# Patient Record
Sex: Male | Born: 1945 | Race: White | Hispanic: No | Marital: Married | Smoking: Never smoker
Health system: Southern US, Community
[De-identification: ages and names within clinical notes are randomized; demographics above are authoritative.]

## PROBLEM LIST (undated history)

## (undated) DIAGNOSIS — Z87898 Personal history of other specified conditions: Secondary | ICD-10-CM

## (undated) DIAGNOSIS — I10 Essential (primary) hypertension: Secondary | ICD-10-CM

## (undated) DIAGNOSIS — M1990 Unspecified osteoarthritis, unspecified site: Secondary | ICD-10-CM

## (undated) DIAGNOSIS — Z87442 Personal history of urinary calculi: Secondary | ICD-10-CM

## (undated) DIAGNOSIS — E785 Hyperlipidemia, unspecified: Secondary | ICD-10-CM

## (undated) HISTORY — DX: Essential (primary) hypertension: I10

## (undated) HISTORY — DX: Personal history of other specified conditions: Z87.898

## (undated) HISTORY — DX: Unspecified osteoarthritis, unspecified site: M19.90

## (undated) HISTORY — DX: Personal history of urinary calculi: Z87.442

## (undated) HISTORY — PX: TONSILLECTOMY: SHX5217

## (undated) HISTORY — DX: Hyperlipidemia, unspecified: E78.5

## (undated) HISTORY — PX: KNEE SURGERY: SHX244

## (undated) HISTORY — PX: TOTAL KNEE ARTHROPLASTY: SHX125

---

## 2003-12-30 ENCOUNTER — Ambulatory Visit (HOSPITAL_COMMUNITY): Admission: RE | Admit: 2003-12-30 | Discharge: 2003-12-30 | Payer: Self-pay | Admitting: Orthopedic Surgery

## 2004-01-30 ENCOUNTER — Ambulatory Visit (HOSPITAL_COMMUNITY): Admission: RE | Admit: 2004-01-30 | Discharge: 2004-01-30 | Payer: Self-pay | Admitting: Orthopedic Surgery

## 2004-01-30 ENCOUNTER — Ambulatory Visit (HOSPITAL_BASED_OUTPATIENT_CLINIC_OR_DEPARTMENT_OTHER): Admission: RE | Admit: 2004-01-30 | Discharge: 2004-01-30 | Payer: Self-pay | Admitting: Orthopedic Surgery

## 2004-04-17 ENCOUNTER — Ambulatory Visit: Payer: Self-pay | Admitting: Internal Medicine

## 2004-09-16 ENCOUNTER — Ambulatory Visit: Payer: Self-pay | Admitting: Internal Medicine

## 2004-09-23 ENCOUNTER — Ambulatory Visit: Payer: Self-pay | Admitting: Internal Medicine

## 2005-03-24 ENCOUNTER — Ambulatory Visit: Payer: Self-pay | Admitting: Internal Medicine

## 2005-05-02 ENCOUNTER — Ambulatory Visit: Payer: Self-pay | Admitting: Gastroenterology

## 2005-05-16 ENCOUNTER — Ambulatory Visit: Payer: Self-pay | Admitting: Gastroenterology

## 2005-05-16 ENCOUNTER — Encounter: Payer: Self-pay | Admitting: Internal Medicine

## 2005-05-16 ENCOUNTER — Encounter (INDEPENDENT_AMBULATORY_CARE_PROVIDER_SITE_OTHER): Payer: Self-pay | Admitting: Specialist

## 2005-09-25 ENCOUNTER — Ambulatory Visit: Payer: Self-pay | Admitting: Internal Medicine

## 2005-10-02 ENCOUNTER — Ambulatory Visit: Payer: Self-pay | Admitting: Internal Medicine

## 2005-12-30 ENCOUNTER — Ambulatory Visit: Payer: Self-pay | Admitting: Internal Medicine

## 2005-12-31 ENCOUNTER — Encounter: Admission: RE | Admit: 2005-12-31 | Discharge: 2005-12-31 | Payer: Self-pay | Admitting: Internal Medicine

## 2006-03-31 ENCOUNTER — Ambulatory Visit: Payer: Self-pay | Admitting: Internal Medicine

## 2006-05-12 ENCOUNTER — Ambulatory Visit: Payer: Self-pay | Admitting: Internal Medicine

## 2006-08-11 ENCOUNTER — Ambulatory Visit: Payer: Self-pay | Admitting: Internal Medicine

## 2006-10-21 DIAGNOSIS — E785 Hyperlipidemia, unspecified: Secondary | ICD-10-CM

## 2006-10-21 DIAGNOSIS — M1990 Unspecified osteoarthritis, unspecified site: Secondary | ICD-10-CM | POA: Insufficient documentation

## 2006-10-21 DIAGNOSIS — I10 Essential (primary) hypertension: Secondary | ICD-10-CM

## 2006-10-21 DIAGNOSIS — Z87898 Personal history of other specified conditions: Secondary | ICD-10-CM

## 2006-10-21 HISTORY — DX: Hyperlipidemia, unspecified: E78.5

## 2006-10-21 HISTORY — DX: Personal history of other specified conditions: Z87.898

## 2006-10-21 HISTORY — DX: Unspecified osteoarthritis, unspecified site: M19.90

## 2006-10-21 HISTORY — DX: Essential (primary) hypertension: I10

## 2007-03-01 ENCOUNTER — Ambulatory Visit: Payer: Self-pay | Admitting: Internal Medicine

## 2007-08-23 ENCOUNTER — Ambulatory Visit: Payer: Self-pay | Admitting: Internal Medicine

## 2007-08-23 LAB — CONVERTED CEMR LAB
BUN: 16 mg/dL (ref 6–23)
Bilirubin Urine: NEGATIVE
Blood in Urine, dipstick: NEGATIVE
Creatinine, Ser: 1.1 mg/dL (ref 0.4–1.5)
GFR calc Af Amer: 87 mL/min
Glucose, Bld: 111 mg/dL — ABNORMAL HIGH (ref 70–99)
Glucose, Urine, Semiquant: NEGATIVE
HDL: 32.7 mg/dL — ABNORMAL LOW (ref >39.0)
Ketones, urine, test strip: NEGATIVE
Lymphocytes Relative: 22.6 % (ref 12.0–46.0)
MCHC: 35 g/dL (ref 30.0–36.0)
Neutro Abs: 3.9 10*3/uL (ref 1.4–7.7)
Neutrophils Relative %: 65.2 % (ref 43.0–77.0)
Nitrite: NEGATIVE
PSA: 1.76 ng/mL (ref 0.10–4.00)
Potassium: 3.8 meq/L (ref 3.5–5.1)
Protein, U semiquant: NEGATIVE
RBC: 4.51 M/uL (ref 4.22–5.81)
Sodium: 142 meq/L (ref 135–145)
TSH: 0.86 microintl units/mL (ref 0.35–5.50)
Total Bilirubin: 0.9 mg/dL (ref 0.3–1.2)
Triglycerides: 189 mg/dL — ABNORMAL HIGH (ref 0–149)
Urobilinogen, UA: 0.2
VLDL: 38 mg/dL (ref 0–40)

## 2007-08-27 ENCOUNTER — Encounter: Payer: Self-pay | Admitting: Internal Medicine

## 2007-08-30 ENCOUNTER — Ambulatory Visit: Payer: Self-pay | Admitting: Internal Medicine

## 2007-08-30 DIAGNOSIS — N400 Enlarged prostate without lower urinary tract symptoms: Secondary | ICD-10-CM

## 2007-08-30 DIAGNOSIS — Z87442 Personal history of urinary calculi: Secondary | ICD-10-CM

## 2007-08-30 HISTORY — DX: Personal history of urinary calculi: Z87.442

## 2008-02-28 ENCOUNTER — Telehealth: Payer: Self-pay | Admitting: Internal Medicine

## 2008-04-18 ENCOUNTER — Ambulatory Visit: Payer: Self-pay | Admitting: Internal Medicine

## 2008-04-24 ENCOUNTER — Encounter: Payer: Self-pay | Admitting: Internal Medicine

## 2008-04-24 ENCOUNTER — Telehealth (INDEPENDENT_AMBULATORY_CARE_PROVIDER_SITE_OTHER): Payer: Self-pay

## 2008-05-10 ENCOUNTER — Inpatient Hospital Stay (HOSPITAL_COMMUNITY): Admission: RE | Admit: 2008-05-10 | Discharge: 2008-05-14 | Payer: Self-pay | Admitting: Orthopedic Surgery

## 2008-05-25 ENCOUNTER — Telehealth: Payer: Self-pay | Admitting: Internal Medicine

## 2008-09-05 ENCOUNTER — Ambulatory Visit: Payer: Self-pay | Admitting: Internal Medicine

## 2008-09-05 LAB — CONVERTED CEMR LAB
Albumin: 4.2 g/dL (ref 3.5–5.2)
Alkaline Phosphatase: 71 units/L (ref 39–117)
BUN: 22 mg/dL (ref 6–23)
Basophils Absolute: 0.1 10*3/uL (ref 0.0–0.1)
Bilirubin, Direct: 0.1 mg/dL (ref 0.0–0.3)
CO2: 29 meq/L (ref 19–32)
Chloride: 112 meq/L (ref 96–112)
Cholesterol: 178 mg/dL (ref 0–200)
Creatinine, Ser: 1.2 mg/dL (ref 0.4–1.5)
Direct LDL: 105.6 mg/dL
Eosinophils Absolute: 0.1 10*3/uL (ref 0.0–0.7)
GFR calc non Af Amer: 64.88 mL/min (ref >60)
HCT: 38.9 % — ABNORMAL LOW (ref 39.0–52.0)
HDL: 39.9 mg/dL (ref >39.00)
Neutrophils Relative %: 64.6 % (ref 43.0–77.0)
Nitrite: NEGATIVE
PSA: 2.41 ng/mL (ref 0.10–4.00)
Platelets: 279 10*3/uL (ref 150.0–400.0)
RDW: 14.3 % (ref 11.5–14.6)
TSH: 0.85 microintl units/mL (ref 0.35–5.50)
Total Protein: 7.3 g/dL (ref 6.0–8.3)
Urobilinogen, UA: 0.2
WBC Urine, dipstick: NEGATIVE
pH: 6

## 2008-09-15 ENCOUNTER — Ambulatory Visit: Payer: Self-pay | Admitting: Internal Medicine

## 2008-10-09 ENCOUNTER — Telehealth: Payer: Self-pay | Admitting: Internal Medicine

## 2008-10-09 ENCOUNTER — Encounter: Payer: Self-pay | Admitting: Internal Medicine

## 2008-11-15 ENCOUNTER — Inpatient Hospital Stay (HOSPITAL_COMMUNITY): Admission: RE | Admit: 2008-11-15 | Discharge: 2008-11-19 | Payer: Self-pay | Admitting: Orthopedic Surgery

## 2009-03-19 ENCOUNTER — Ambulatory Visit: Payer: Self-pay | Admitting: Internal Medicine

## 2009-09-14 ENCOUNTER — Ambulatory Visit: Payer: Self-pay | Admitting: Internal Medicine

## 2009-09-14 LAB — CONVERTED CEMR LAB
AST: 25 units/L (ref 0–37)
Albumin: 4.3 g/dL (ref 3.5–5.2)
Basophils Relative: 0.8 % (ref 0.0–3.0)
Bilirubin, Direct: 0.1 mg/dL (ref 0.0–0.3)
Cholesterol: 195 mg/dL (ref 0–200)
Direct LDL: 112 mg/dL
Eosinophils Relative: 1.6 % (ref 0.0–5.0)
HCT: 40.7 % (ref 39.0–52.0)
Ketones, urine, test strip: NEGATIVE
MCHC: 34.5 g/dL (ref 30.0–36.0)
MCV: 90.4 fL (ref 78.0–100.0)
Monocytes Relative: 10.1 % (ref 3.0–12.0)
Nitrite: NEGATIVE
Platelets: 268 10*3/uL (ref 150.0–400.0)
RBC: 4.5 M/uL (ref 4.22–5.81)
RDW: 14.3 % (ref 11.5–14.6)
Total CHOL/HDL Ratio: 4
Total Protein: 7 g/dL (ref 6.0–8.3)
Triglycerides: 215 mg/dL — ABNORMAL HIGH (ref 0.0–149.0)
VLDL: 43 mg/dL — ABNORMAL HIGH (ref 0.0–40.0)
WBC Urine, dipstick: NEGATIVE
WBC: 6.4 10*3/uL (ref 4.5–10.5)

## 2009-09-21 ENCOUNTER — Ambulatory Visit: Payer: Self-pay | Admitting: Internal Medicine

## 2010-05-14 NOTE — Assessment & Plan Note (Signed)
Summary: cpx/njr   Vital Signs:  Patient profile:   65 year old male Height:      70.5 inches Weight:      238 pounds BMI:     33.79 Temp:     97.6 degrees F oral BP sitting:   120 / 70  (left arm) Cuff size:   large  Vitals Entered By: Duard Brady LPN (September 21, 2009 8:49 AM) And the eye.  A the he and a theCC: cpx - doing well , Hypertension Management Is Patient Diabetic? No   CC:  cpx - doing well  and Hypertension Management.  History of Present Illness: 65 year old patient who is seen today for a wellness exam;  medical problems include hypertension, dyslipidemia osteoarthritis.  He also has a history of nephrolithiasis and mild BPH.  [place].  He had bilateral total knee replacement surgeries.  He is doing quite well.  He does monitor her blood pressure readings at home with nice  results.  He denies any cardiopulmonary complaints.  Hypertension History:      Positive major cardiovascular risk factors include male age 2 years old or older, hyperlipidemia, and hypertension.  Negative major cardiovascular risk factors include non-tobacco-user status.     Preventive Screening-Counseling & Management  Alcohol-Tobacco     Smoking Status: never  Allergies (verified): No Known Drug Allergies  Past History:  Past Medical History: Hyperlipidemia Hypertension Nephrolithiasis, hx of calcium oxalate stones Benign prostatic hypertrophy Osteoarthritis slp R TKR surgery 04-2008 s/ L TKR 11-2008  Past Surgical History: Arthroscopic Rt knee sx x 05-1985,2005 Tonsillectomy Colonoscopy-05/16/2005 left total knee  replacement surgery, January 2010 right total knee replacement surgery in September 2010 colonoscopy 05-2005  Family History: Reviewed history from 08/30/2007 and no changes required. Family History Hypertension father died age 13, aortic aneurysm rupture mother is in good health in her late 74s  One brother two sisters positive for hypertension  Social  History: Reviewed history from 08/30/2007 and no changes required. Married two adult childrenSmoking Status:  never  Physical Exam  General:  Well-developed,well-nourished,in no acute distress; alert,appropriate and cooperative throughout examination Head:  Normocephalic and atraumatic without obvious abnormalities. No apparent alopecia or balding. Eyes:  No corneal or conjunctival inflammation noted. EOMI. Perrla. Funduscopic exam benign, without hemorrhages, exudates or papilledema. Vision grossly normal. Ears:  External ear exam shows no significant lesions or deformities.  Otoscopic examination reveals clear canals, tympanic membranes are intact bilaterally without bulging, retraction, inflammation or discharge. Hearing is grossly normal bilaterally. Mouth:  Oral mucosa and oropharynx without lesions or exudates.  Teeth in good repair. Neck:  No deformities, masses, or tenderness noted. Chest Wall:  No deformities, masses, tenderness or gynecomastia noted. Breasts:  No masses or gynecomastia noted Lungs:  Normal respiratory effort, chest expands symmetrically. Lungs are clear to auscultation, no crackles or wheezes. Heart:  Normal rate and regular rhythm. S1 and S2 normal without gallop, murmur, click, rub or other extra sounds. Abdomen:  Bowel sounds positive,abdomen soft and non-tender without masses, organomegaly or hernias noted. Rectal:  No external abnormalities noted. Normal sphincter tone. No rectal masses or tenderness. Genitalia:  R testes atrophic.  R testes atrophic.   Prostate:  1+ enlarged.  1+ enlarged.   Msk:  No deformity or scoliosis noted of thoracic or lumbar spine.   Pulses:  R and L carotid,radial,femoral,dorsalis pedis and posterior tibial pulses are full and equal bilaterally Extremities:  status post bilateral total knee replacement surgeries Neurologic:  alert & oriented X3, cranial  nerves II-XII intact, strength normal in all extremities, gait normal, DTRs  symmetrical and normal, and heel-to-shin normal.  alert & oriented X3, cranial nerves II-XII intact, strength normal in all extremities, gait normal, DTRs symmetrical and normal, and heel-to-shin normal.   Skin:  Intact without suspicious lesions or rashes Cervical Nodes:  No lymphadenopathy noted Axillary Nodes:  No palpable lymphadenopathy Inguinal Nodes:  No significant adenopathy Psych:  Cognition and judgment appear intact. Alert and cooperative with normal attention span and concentration. No apparent delusions, illusions, hallucinations   Impression & Recommendations:  Problem # 1:  PHYSICAL EXAMINATION (ICD-V70.0)  Complete Medication List: 1)  Simvastatin 40 Mg Tabs (Simvastatin) .Marland Kitchen.. 1 once daily 2)  Indocin Sr 75 Mg Cpcr (Indomethacin) .Marland Kitchen.. 1 once daily 3)  Amlodipine Besylate 10 Mg Tabs (Amlodipine besylate) .Marland Kitchen.. 1 once daily 4)  Benazepril Hcl 40 Mg Tabs (Benazepril hcl) .Marland Kitchen.. 1 once daily 5)  Chlorthalidone 25 Mg Tabs (Chlorthalidone) .Marland Kitchen.. 1 once daily 6)  Bayer Aspirin 325 Mg Tabs (Aspirin) .Marland Kitchen.. 1 once daily  Other Orders: EKG w/ Interpretation (93000) Prescription Created Electronically [phone])  Hypertension Assessment/Plan:      The patient's hypertensive risk group is category B: At least one risk factor (excluding diabetes) with no target organ damage.  His calculated 10 year risk of coronary heart disease is 14 %.  Today's blood pressure is 120/70.    Patient Instructions: 1)  Please schedule a follow-up appointment in 1 year. 2)  Limit your Sodium (Salt). 3)  It is important that you exercise regularly at least 20 minutes 5 times a week. If you develop chest pain, have severe difficulty breathing, or feel very tired , stop exercising immediately and seek medical attention. 4)  You need to lose weight. Consider a lower calorie diet and regular exercise.  5)  Check your Blood Pressure regularly. If it is above: 150/90 you should make an  appointment. Prescriptions: CHLORTHALIDONE 25 MG  TABS (CHLORTHALIDONE) 1 once daily  #90 Each x 4   Entered and Authorized by:   [name]  MD   Signed by:   [name]  MD on 09/21/2009   Method used:   Electronically to        [facility] (retail)       [address]       [place], [place]  [address]       Ph: [phone]       Fax: [phone]   RxID:   [id number] BENAZEPRIL HCL 40 MG  TABS (BENAZEPRIL HCL) 1 once daily  #90 Each x 4   Entered and Authorized by:   [name]  MD   Signed by:   [name]  MD on 09/21/2009   Method used:   Electronically to        [facility] (retail)       [address]       [place], [place]  [address]       Ph: [phone]       Fax: [phone]   RxID:   [id number] AMLODIPINE BESYLATE 10 MG  TABS (AMLODIPINE BESYLATE) 1 once daily  #90 Each x 4   Entered and Authorized by:   [name]  MD   Signed by:   [name]  MD on 09/21/2009   Method  used:   Electronically to        [facility] (retail)       [address]       [place], [place]  [address]       Ph: [phone]       Fax: [phone]   RxID:   [id number] SIMVASTATIN 40 MG  TABS (SIMVASTATIN) 1 once daily  #90 Each x 4   Entered and Authorized by:   [name]  MD   Signed by:   [name]  MD on 09/21/2009   Method used:   Electronically to        [facility] (retail)       [address]       [place], [place]  [address]       Ph: [phone]       Fax: [phone]   RxID:   [id number]

## 2010-07-20 LAB — TYPE AND SCREEN
ABO/RH(D): O NEG
Antibody Screen: NEGATIVE

## 2010-07-20 LAB — PROTIME-INR
INR: 0.9 (ref 0.00–1.49)
INR: 1.7 — ABNORMAL HIGH (ref 0.00–1.49)
INR: 2 — ABNORMAL HIGH (ref 0.00–1.49)
Prothrombin Time: 19.5 seconds — ABNORMAL HIGH (ref 11.6–15.2)

## 2010-07-20 LAB — HEMOGLOBIN AND HEMATOCRIT, BLOOD
HCT: 37.3 % — ABNORMAL LOW (ref 39.0–52.0)
Hemoglobin: 12.7 g/dL — ABNORMAL LOW (ref 13.0–17.0)

## 2010-07-20 LAB — CBC
HCT: 31.9 % — ABNORMAL LOW (ref 39.0–52.0)
Hemoglobin: 13.3 g/dL (ref 13.0–17.0)
Hemoglobin: 9.3 g/dL — ABNORMAL LOW (ref 13.0–17.0)
MCHC: 34 g/dL (ref 30.0–36.0)
MCHC: 34.5 g/dL (ref 30.0–36.0)
MCV: 88.1 fL (ref 78.0–100.0)
MCV: 89 fL (ref 78.0–100.0)
RBC: 3.05 MIL/uL — ABNORMAL LOW (ref 4.22–5.81)
RBC: 3.62 MIL/uL — ABNORMAL LOW (ref 4.22–5.81)
WBC: 13.7 10*3/uL — ABNORMAL HIGH (ref 4.0–10.5)
WBC: 7.2 10*3/uL (ref 4.0–10.5)

## 2010-07-20 LAB — URINALYSIS, ROUTINE W REFLEX MICROSCOPIC
Bilirubin Urine: NEGATIVE
Glucose, UA: NEGATIVE mg/dL
Ketones, ur: NEGATIVE mg/dL
Nitrite: NEGATIVE
Urobilinogen, UA: 0.2 mg/dL (ref 0.0–1.0)
pH: 6.5 (ref 5.0–8.0)

## 2010-07-20 LAB — DIFFERENTIAL
Basophils Absolute: 0 10*3/uL (ref 0.0–0.1)
Basophils Relative: 1 % (ref 0–1)
Eosinophils Absolute: 0.1 10*3/uL (ref 0.0–0.7)
Eosinophils Relative: 2 % (ref 0–5)
Lymphocytes Relative: 25 % (ref 12–46)
Monocytes Absolute: 0.9 10*3/uL (ref 0.1–1.0)
Neutrophils Relative %: 60 % (ref 43–77)

## 2010-07-20 LAB — COMPREHENSIVE METABOLIC PANEL
Albumin: 4 g/dL (ref 3.5–5.2)
GFR calc Af Amer: >60 mL/min (ref >60)
Total Bilirubin: 0.7 mg/dL (ref 0.3–1.2)

## 2010-07-20 LAB — URINE CULTURE: Special Requests: NEGATIVE

## 2010-07-20 LAB — APTT: aPTT: 27 seconds (ref 24–37)

## 2010-07-29 LAB — CBC
MCV: 91 fL (ref 78.0–100.0)
Platelets: 249 10*3/uL (ref 150–400)
RDW: 13.3 % (ref 11.5–15.5)
WBC: 10.4 10*3/uL (ref 4.0–10.5)

## 2010-07-29 LAB — CROSSMATCH: ABO/RH(D): O NEG

## 2010-07-29 LAB — HEMOGLOBIN AND HEMATOCRIT, BLOOD
HCT: 23.6 % — ABNORMAL LOW (ref 39.0–52.0)
HCT: 24.1 % — ABNORMAL LOW (ref 39.0–52.0)
HCT: 37.1 % — ABNORMAL LOW (ref 39.0–52.0)
Hemoglobin: 10.8 g/dL — ABNORMAL LOW (ref 13.0–17.0)
Hemoglobin: 12.5 g/dL — ABNORMAL LOW (ref 13.0–17.0)
Hemoglobin: 8.1 g/dL — ABNORMAL LOW (ref 13.0–17.0)

## 2010-07-29 LAB — PROTIME-INR
INR: 0.9 (ref 0.00–1.49)
INR: 1.1 (ref 0.00–1.49)
INR: 1.6 — ABNORMAL HIGH (ref 0.00–1.49)
INR: 1.7 — ABNORMAL HIGH (ref 0.00–1.49)
Prothrombin Time: 14.3 seconds (ref 11.6–15.2)
Prothrombin Time: 18.5 seconds — ABNORMAL HIGH (ref 11.6–15.2)
Prothrombin Time: 19.3 seconds — ABNORMAL HIGH (ref 11.6–15.2)

## 2010-07-29 LAB — URINE CULTURE
Colony Count: NO GROWTH
Culture: NO GROWTH

## 2010-07-29 LAB — COMPREHENSIVE METABOLIC PANEL
AST: 18 U/L (ref 0–37)
Albumin: 4.2 g/dL (ref 3.5–5.2)
Chloride: 103 mEq/L (ref 96–112)
Creatinine, Ser: 1.01 mg/dL (ref 0.4–1.5)
GFR calc Af Amer: >60 mL/min (ref >60)
Potassium: 4.4 mEq/L (ref 3.5–5.1)
Total Bilirubin: 0.7 mg/dL (ref 0.3–1.2)
Total Protein: 7.1 g/dL (ref 6.0–8.3)

## 2010-07-29 LAB — DIFFERENTIAL
Basophils Absolute: 0 10*3/uL (ref 0.0–0.1)
Eosinophils Relative: 1 % (ref 0–5)
Lymphocytes Relative: 16 % (ref 12–46)
Lymphs Abs: 1.7 10*3/uL (ref 0.7–4.0)
Monocytes Absolute: 0.9 10*3/uL (ref 0.1–1.0)
Monocytes Relative: 9 % (ref 3–12)
Neutro Abs: 7.7 10*3/uL (ref 1.7–7.7)

## 2010-07-29 LAB — URINALYSIS, ROUTINE W REFLEX MICROSCOPIC
Nitrite: NEGATIVE
Protein, ur: NEGATIVE mg/dL
Specific Gravity, Urine: 1.021 (ref 1.005–1.030)
Urobilinogen, UA: 0.2 mg/dL (ref 0.0–1.0)

## 2010-07-29 LAB — TYPE AND SCREEN
ABO/RH(D): O NEG
Antibody Screen: NEGATIVE

## 2010-08-27 NOTE — Op Note (Signed)
NAME:  Jerry Beasley, Jerry Beasley               ACCOUNT NO.:  [id number]   MEDICAL RECORD NO.:  [id number]          PATIENT TYPE:  INP   LOCATION:  0010                         FACILITY:  [facility]   PHYSICIAN:  Georges Lynch. Gioffre, M.D.DATE OF BIRTH:  1945-06-10   DATE OF PROCEDURE:  DATE OF DISCHARGE:                               OPERATIVE REPORT   PREOPERATIVE DIAGNOSIS:  Degenerative arthritis of the left knee.   POSTOPERATIVE DIAGNOSIS:  Degenerative arthritis of the left knee.   OPERATION:  A left total knee arthroplasty utilizing the DePuy system.   I utilized, as far as the sizes, the patella was sized 41 mm with three  pegs.  The femoral component was a size 4 narrow, posterior stabilized  type, left.  The insert was a size 4 and 12.5 mm thickness.  The tibial  tray was a size 4 cemented and I used vancomycin in the cement.   PROCEDURE:  Under general anesthesia, routine orthopedic prep and  draping of the left lower extremity was carried out.  The leg was  exsanguinated and Esmarch tourniquet was elevated to 375 mmHg.  At this  time, the knee was flexed.  The anterior approach of the knee was  carried out.  Two flaps were created.  I then carried out a median  parapatellar incision reflecting the patella laterally and flexed the  knee and did medial lateral meniscectomies.  Incised the anterior and  posterior cruciate ligaments.  At this time, removed the spurs from the  femur, patella and the tibia.  Noted that he had a marked overgrowth of  bone on the medial side of his femur.  At this time, initial drill hole  was made in the intercondylar notch.  The intramedullary guide rod was  inserted and I removed 12 mm thickness off the distal femur because of  his contracture.  At that time the #2 jig was inserted for measurement  purposes.  We measured the femur to be a size 4.  At that particular  time we then cut our femur for a size 4, left.  We did anterior,  posterior and chamfer cuts.   We then went down and prepared the tibia in  usual fashion.  We removed 4 mm thickness off the tibial plateau  utilizing the medial side of the guide.  Once this was carried out.  We  then cut our keel, cut out of the proximal tibial plateau.  I then went  on and cut our notch out of the distal femur in the usual fashion.  We  then checked for posterior spurs.  We then inserted our spacer blocks in  for tension purposes.  We then removed those and inserted our trial  components.  We first tried the standard for a size 4 left femur.  There  was a little minimal overhang so we went to the narrow and it fit quite  nicely.  We then inserted our tibial tray and our size 4 with 12 mm  thickness tibial insert.  We then went through the trials.  We felt that  was our  best fit at that time.  We then cut our patella.  We did a  resurfacing procedure on the patella in the usual fashion.  We measured  the patella to be a size 41 and three drill holes were made in the  articular side of the patella.  Once this was done, we removed all trial  components and thoroughly water picked out the knee and then cemented  all three components in simultaneously.  Vancomycin was used in the  cement.  At that time, we then searched for loose pieces of cement.  We  removed all the loose pieces cement and water picked out the knee again  and then inserted our permanent size 4 with 12.5 mm thickness rotating  platform insert and reduced the knee.  We had good flexion and extension  and good stability medial and laterally.  We then water picked out the  knee and then prior to doing this we did inject 30 mL of 0.25% Marcaine  with 30 mg of Toradol.  We did that into the soft tissue.  After that we  inserted a Hemovac drain, closed the knee in layers in the usual  fashion.  The sterile Neosporin bundle dressing was applied.  The  patient had 2 grams of IV Ancef preop.   SURGEON:  Georges Lynch. Darrelyn Hillock, M.D.   ASSISTANT:   Jamelle Rushing, P.A.           ______________________________  Georges Lynch. Darrelyn Hillock, M.D.     RAG/MEDQ  D:  05/10/2008  T:  05/10/2008  Job:  [id number]   cc:   [name], MD  [address] [place]  [place] [address]

## 2010-08-27 NOTE — Op Note (Signed)
NAME:  Jerry Beasley, Jerry Beasley               ACCOUNT NO.:  [id number]   MEDICAL RECORD NO.:  [id number]          PATIENT TYPE:  INP   LOCATION:  0002                         FACILITY:  [facility]   PHYSICIAN:  Georges Lynch. Gioffre, M.D.DATE OF BIRTH:  1945/07/31   DATE OF PROCEDURE:  11/15/2008  DATE OF DISCHARGE:                               OPERATIVE REPORT   ASSISTANT:  Rozell Searing, PA.   PREOPERATIVE DIAGNOSIS:  Severe degenerative arthritis with a varus  deformity right knee.   POSTOPERATIVE DIAGNOSIS:  Severe degenerative arthritis with a varus  deformity right knee.   OPERATION:  A right total knee arthroplasty utilizing the DePuy system.  All three components were cemented and gentamicin was used in the  cement.  The sizes used were as follows: A size 5 right posterior  stabilized femoral component.  The tibial component was a size 5, 12.5  mm thickness.  The tibial tray was a size 4 cemented.  The patella was a  41 mm.   PROCEDURE:  Under general anesthesia routine orthopedic prep and draping  of the right lower extremity was carried out.  Leg was exsanguinated  with Esmarch.  Tourniquet was elevated to 375 mmHg.  Following that the  knee was flexed.  An incision was made over the anterior aspect of the  right knee.  Note the patient did have 2 g of IV Ancef.  Two flaps were  created.  Self-retaining retractors were inserted.  I then carried out a  median parapatellar incision.  The patella was reflected and the knee  was flexed.  At this time I did medial and lateral meniscectomies and  excised the anterior and posterior cruciate ligaments.  Following that I  then went on and removed all the spurs.   I then made my initial drill, cut the intercondylar notch, and a #1 jig  was inserted.  I removed 12 mm thickness off the distal femur because of  his contracture and his body size.  At this time, a #2 jig was inserted.  We carried out anterior-posterior chamfer cuts for a size 5 right  femur.  Following that we went ahead and prepared the tibia for a size 4 tibial  tray.  The appropriate intramedullary guide was inserted, 4 mm thickness  was taken off of the affected medial side.  Following that we cut our  keel cut out in the usual fashion.  Note, prior to doing that we did go  through all of our cuts and then utilized the spacer blocks.  Following  that we then, after preparing the tibia for a size 4 tray, cut our notch  cut-out of the distal femur.  I thoroughly irrigated out the area,  inserted our trial components, and we first started out with 10 mm  thickness insert then went to a 12.5-mm thickness insert for the tibial  portion.  I then did a resurfacing procedure on the patella in the usual  fashion.  Patella was cut in about 40-mm thickness.  Three drill holes  were made the patella for a size 41 patella.  After  that was done all  the trial components were removed.   We then thoroughly irrigated out the area and then dried the area out  and cemented all 3 components in simultaneously utilizing gentamicin in  the cement.  Once the cement was hardened we removed all loose pieces of  cement.  We then removed our trial tibial insert and looked for more  pieces of cement and removed those, waterpikked the knee out  posteriorly.  After that we then went ahead and inserted our permanent  12.5-mm thickness size 5 tibial tray.  We reduced the knee and had good  function of the knee; good flexion, good extension, good  lateral medial stability.  I then inserted the Hemovac drain.  FloSeal  10 cc, then was inserted into the joint surface soft tissue areas.  I  then reapproximated the wound in layers in the usual fashion.  Skin was  closed with metal staples.  Sterile Neosporin dressing was applied.  The  patient left the operative satisfactory condition.           ______________________________  Georges Lynch Darrelyn Hillock, M.D.     RAG/MEDQ  D:  11/15/2008  T:  11/15/2008   Job:  [id number]   cc:   [name], MD  [address] [place]  [place] [address]

## 2010-08-27 NOTE — H&P (Signed)
NAME:  Jerry Beasley, Jerry Beasley               ACCOUNT NO.:  [id number]   MEDICAL RECORD NO.:  [id number]          PATIENT TYPE:  INP   LOCATION:                               FACILITY[facility]  [facility]   PHYSICIAN:  Georges Lynch. Gioffre, M.D.DATE OF BIRTH:  06-30-1945   DATE OF ADMISSION:  05/10/2008  DATE OF DISCHARGE:                              HISTORY & PHYSICAL   DATE OF ADMISSION:  May 10, 2008.   CHIEF COMPLAINT:  Bilateral knee pain.   HISTORY OF PRESENT ILLNESS:  The patient is a 65 year old gentleman who  has been evaluated by Dr. Darrelyn Hillock for bilateral knee pain.  He is found  to have end-stage osteoarthritis bilateral knees with bone-on-bone  medial compartments and significant patellofemoral changes.  He has  failed conservative treatment.  The patient has elected to proceed with  a left total knee arthroplasty first.   ALLERGIES:  No known drug allergies.   PRIMARY CARE PHYSICIAN:  [name], MD.   CURRENT MEDICATIONS:  1. Indocin 75 mg a Jerry Beasley and will stop at this point.  2. Benazepril 40 mg a Jerry Beasley.  3. Simvastatin 40 mg a Jerry Beasley.  4. Amlodipine 10 mg a Jerry Beasley.  5. Chlorthalidone 25 mg a Jerry Beasley.   PAST MEDICAL HISTORY:  1. Includes hypertension.  2. Hypercholesterolemia.  3. History of kidney stones in 2007.   REVIEW OF SYSTEMS:  Is negative for any neurologic issues.  PULMONARY:  Is unremarkable.  CARDIOVASCULAR:  Blood pressure with medicines stable.  He has never had a stress test.  He denies any chest pains.  GU is  unremarkable.  GI is unremarkable.  HEMATOLOGIC:  Is unremarkable.   PAST SURGICAL HISTORY:  Includes right knee surgery x2 without any  complications with anesthesia.   FAMILY MEDICAL HISTORY:  Father is deceased from abdominal aortic  aneurysm.  Mother is alive at the age of 17, in good health.   SOCIAL HISTORY:  Patient is married.  He is an Nature conservation officer.  He  has smoked previous in early 20s, nothing since.  No street or alcohol  drug issues.  He  has two children, lives with his family in a two-story  home.   PHYSICAL EXAM:  VITALS:  Height is 6 feet, weight is 230, blood pressure  is 138/78, pulse of 70, respirations 12.  Patient is afebrile.  GENERAL:  This is a healthy-appearing, well-developed gentleman,  conscious, alert and appropriate.  Appears to be good historian.  Does  walk with am obvious bow-legged type deformity.  HEENT:  Head was normocephalic.  Pupils equal, round, reactive.  Gross  hearing is intact.  Buccal mucosa is pink and moist.  NECK:  Supple.  No palpable lymphadenopathy.  No thyroid discomfort.  Good range of motion.  CHEST:  Lung sounds were clear and equal bilaterally.  No wheezes,  rales, rhonchi.  HEART:  Regular rate and rhythm.  ABDOMEN:  Soft, nontender.  No CVA region discomfort.  Bowel sounds  normal.  UPPER EXTREMITIES:  Have an excellent range of motion in shoulders,  elbows, wrists.  Motor strength was  5/5.  LOWER EXTREMITIES:  He has good range of motion of both hips.  Both  knees fully extend and flex back to 120 degrees.  He has no instability.  He has no effusions.  They are boggy appearing.  He has no calf  tenderness.  No signs of infections about his lower extremities.  Good  motion at the ankles.  PERIPHERAL VASCULAR:  Carotid pulses were 2+, no bruits.  Radial pulses  2+, dorsalis pedis pulses 2+.  He had no lower extremity edema.  NEURO:  The patient was conscious, alert and appropriate.  Good  historian.  BREAST, RECTAL AND GU:  Exams deferred at this time.   IMPRESSION:  1. End-stage osteoarthritis bilateral knees, left greater than right.  2. Hypertension.  3. Hypercholesterolemia.  4. History of kidney stones.   PLAN:  The patient will undergo all routine laboratories and tests prior  to having a left total knee arthroplasty by Dr. Darrelyn Hillock at [facility] on May 10, 2008.      Jamelle Rushing, P.A.    ______________________________  Georges Lynch  Darrelyn Hillock, M.D.    RWK/MEDQ  D:  05/03/2008  T:  05/03/2008  Job:  [id number]   cc:   Windy Fast A. Darrelyn Hillock, M.D.  Fax: [phone]

## 2010-08-30 NOTE — Discharge Summary (Signed)
Jerry Beasley, Jerry Beasley               ACCOUNT NO.:  [id number]   MEDICAL RECORD NO.:  [id number]          PATIENT TYPE:  INP   LOCATION:  1603                         FACILITY:  [facility]   PHYSICIAN:  Jerry Lynch. Gioffre, M.D.DATE OF BIRTH:  03/07/1946   DATE OF ADMISSION:  05/10/2008  DATE OF DISCHARGE:  05/14/2008                               DISCHARGE SUMMARY   ADMISSION DIAGNOSES:  1. End-stage osteoarthritis, bilateral knees, left greater than right.  2. Hypertension.  3. Hypercholesterolemia.  4. History of kidney stones.   DISCHARGE DIAGNOSES:  1. Left total knee arthroplasty.  2. Postoperative blood loss anemia, symptomatic, required 2 units of      packed red blood cells.  3. Hypertension.  4. Hypercholesterolemia.  5. History of kidney stones.   HISTORY OF PRESENT ILLNESS:  Patient is a 65 year old gentleman who was  evaluated by Dr. Darrelyn Beasley for significant progressive worsening bilateral  knee pain.  He was found to have end-stage osteoarthritis, bilateral  knees, with bone-on-bone medial compartments with significant  patellofemoral changes.  Patient has failed conservative treatment.  Patient has elected to proceed with a left total knee arthroplasty  first.   ALLERGIES:  NO KNOWN DRUG ALLERGIES.   CURRENT MEDICATIONS:  1. Indocin 75 mg once a day and will stop prior to surgery.  2. Benazepril 40 mg once a day.  3. Simvastatin 40 mg once a day.  4. Amlodipine 10 mg once a day.  5. Chlorthalidone 25 mg once a day.   SURGICAL PROCEDURE:  On May 10, 2008, patient was taken to the OR by  Dr. Ranee Gosselin, assisted by Oneida Alar, P.A.-C.  Under general  anesthesia, the patient underwent a left total knee arthroplasty with a  DePuy rotating platform system.  Patient tolerated the procedure well.  There were no complications.  Minimal blood loss.  Patient was  transferred to the recovery room and then to the orthopedic floor in  good condition.  Patient had the  following components implanted:  A size  4 narrow left femoral component, a size 4 keeled tibial tray, a size  four 12.5-mm thickness polyethylene bearing, a 41-mm 3 peg patella.  All  components were implanted with polymethyl methacrylate with vancomycin  mixed in.  Patient tolerated the procedure well and was transferred to  the floor for total knee protocol.   CONSULTS:  The following routine counseled the requested physical  therapy, occupational therapy, case management, pharmacy for Coumadin  monitoring.   HOSPITAL COURSE:  On May 10, 2008, patient was admitted to [facility] under the care of Dr. Ranee Gosselin.  Patient was taken  to the OR where a left total knee arthroplasty was performed without any  complications.  Patient was transferred to the recovery room and then to  the orthopedic floor in good condition with IV antibiotics, pain  medicines, and Coumadin for DVT prophylaxis on a total knee protocol.  Patient then had 4 days postoperative course in which the patient did  develop some postoperative blood loss anemia.  On admission, his  hemoglobin was 14, he dropped  to 8.1.  He was on the pale sided, a  little lethargic.  He was slightly tachycardic so prior to discharge he  was typed and crossed and transfused 2 units of packed red blood cells.  Patient tolerated this well.  Otherwise, patient's vital signs remained  stable.  He remained afebrile.  Patient was able to wean off his IV  antibiotics and pain medicines without any issues.  His wound remained  benign for any signs of infections.  Leg remained neuromotor vascularly  intact.  Patient progressed well with physical therapy.  He was able to  ambulate in excess of 65 feet.  He followed instructions well, was able  to progress with his exercises so on postop day #4 after his transfusion  he tolerated it well.  He was discharged in good condition with routine  outpatient followup with home health  physical therapy and an R.N. for  Coumadin monitoring.   LABS:  CBC on admission found WBCs 10.4, hemoglobin 14.3, hematocrit  42.4, platelets 249.  On discharge, his H and H was 8.1 and 23.2.  It  was not checked after that but he tolerated it well without any issues.  His INR on discharge is 1.7.  Routine chemistries on admission within  normal limits with a glucose of 135.  Estimated GFR was greater than 60.  Urinalysis on admission was in normal limits with no growth.  Patient  was typed and crossed and transfused 2 units of packed red blood cells  without any side effects.   DISCHARGE INSTRUCTIONS:   DIET:  No restrictions.   ACTIVITY:  He is to increase his activity slowly with use of a walker  and physical therapy instructions.   WOUND CARE:  He is to change his dressing on a daily basis.   FOLLOWUP:  He needs a 2-week followup appointment with Dr. Darrelyn Beasley in  his office.  Patient is to call [phone] for that appointment.  Home  health care physical therapy through [place] [facility] with Coumadin monitoring.   MEDICATIONS:  1. Coumadin 7.5 mg once a day until changed by [place] pharmacist.  2. Robaxin 500 mg once every 6 hours for muscle spasms if needed.  3. Percocet 10/650 one or 2 tablets every 6 hours for muscle spasms if      needed.  4. Amlodipine 10 mg once a day.  5. Chlorthalidone 25 mg once a day.  6. Simvastatin 40 mg once a day.  7. Benazepril 40 mg once a day.  8. Indomethacin, on hold.  9. Aspirin on hold until done with Coumadin.   PATIENT'S CONDITION UPON DISCHARGE TO HOME:  Listed as improved and  good.      Jerry Beasley, P.A.    ______________________________  Jerry Beasley, M.D.    Jerry Beasley  D:  05/31/2008  T:  05/31/2008  Job:  [id number]   cc:   Windy Fast A. Darrelyn Beasley, M.D.  Fax: [phone]

## 2010-08-30 NOTE — Op Note (Signed)
NAME:  Jerry Beasley, Jerry Beasley               ACCOUNT NO.:  [id number]   MEDICAL RECORD NO.:  [id number]          PATIENT TYPE:  AMB   LOCATION:  NESC                         FACILITY:  [facility]   PHYSICIAN:  Georges Lynch. Gioffre, M.D.DATE OF BIRTH:  15-Jan-1946   DATE OF PROCEDURE:  01/30/2004  DATE OF DISCHARGE:                                 OPERATIVE REPORT   SURGEON:  Georges Lynch. Darrelyn Hillock, M.D.   ASSISTANT:  Nurse.   PREOPERATIVE DIAGNOSES:  1.  Degenerative arthritis, right knee.  2.  Complete tear to the medial meniscus, right knee.   POSTOPERATIVE DIAGNOSES:  1.  Degenerative arthritis, right knee.  2.  Complete tear to the medial meniscus, right knee.   OPERATIONS:  1.  Diagnostic arthroscopy, right knee.  2.  Abrasion and chondroplasty, medial femoral condyle -- right knee.  3.  Medial meniscectomy, right knee.   DESCRIPTION OF PROCEDURE:  Under general anesthesia, routine orthopedic prep  and draping of the right lower extremity was carried out.  He had 1 g of IV  Ancef.   A small incision was made in the suprapatellar pouch, and inflow cannula was  inserted and the knee was distended with saline.  Another small incision was  made in the anterolateral joint, and the arthroscope was entered and  complete diagnostic arthroscopy was carried out.  He had one small loose  body that I removed easily.  Following this, I noted the suprapatellar pouch  region was fine, and the patella looked good.  Went over the lateral joint  and he had obvious loss of cartilage of the distal femur and lateral femoral  condyle.  The lateral meniscus was intact.  The ACL and PCL were intact.  Medial joint space was the main problem; he had rather severe degenerative  changes involving the tibial plateau as well as the medial femoral condyle.  He also had a severe complex tear of the medial meniscus.   I introduced the shaver-sucker device, did an abrasion and chondroplasty of  the medial femoral condyle and  also did a partial medial meniscectomy.  I  thoroughly irrigated out the knee, removed all the fluid.  I then closed all  three punctate incisions with 3-0 nylon suture.  I injected 30 cc of 0.5%  Marcaine with epinephrine into the knee joint.  Sterile Neosporin bundle  dressing was applied.   FOLLOWUP CARE:  1.  He will be on crutches for weightbearing as tolerated.  2.  He will be on aspirin 325 mg b.i.d., starting today and for two weeks as      an anticoagulant.  3.  He will be on [facility] for pain.  4.  He will see me in the office in 12-14 days, or prior to if there are      problems.      RAG/MEDQ  D:  01/30/2004  T:  01/30/2004  Job:  [id number]

## 2010-08-30 NOTE — Discharge Summary (Signed)
Jerry Beasley, Jerry Beasley               ACCOUNT NO.:  [id number]   MEDICAL RECORD NO.:  [id number]          PATIENT TYPE:  INP   LOCATION:  1617                         FACILITY:  [facility]   PHYSICIAN:  Georges Lynch. Gioffre, M.D.DATE OF BIRTH:  08-30-1945   DATE OF ADMISSION:  11/15/2008  DATE OF DISCHARGE:  11/19/2008                               DISCHARGE SUMMARY   ADMITTING DIAGNOSES:  1. Degenerative arthritis, right knee.  2. Hypertension.  3. Hypercholesterolemia.   DISCHARGE DIAGNOSES:  1. Degenerative arthritis, right knee, status post left total knee      arthroplasty.  2. Hypertension.  3. Hypercholesterolemia.   PROCEDURE:  On November 15, 2008, right total knee arthroplasty.  Surgeon,  Dr. Darrelyn Hillock.  Assistant, Rozell Searing, P.A.-C.  Under general  anesthesia.   CONSULTATIONS:  None.   BRIEF HISTORY:  Jerry Beasley is a 65 year old gentleman who has been  followed by Dr. Darrelyn Hillock for worsening bilateral knee pain.  Patient  states that both knees are painful but the right knee seems to be more  painful.  He has had arthroscopy done on this knee in the past in 2005  and feels that his knee has not been 100% since that point.  Patient  states that his knee is achy, gives away frequently, and is preventing  him from doing things he would like to do.  Radiographs reveal that the  patient is bone on bone in the medial compartment.  Patient also has  tried conservative measures of joint injections without relief.  Patient  now presents for right total knee arthroplasty.   LABORATORY DATA:  Preoperative CBC revealed white count of 7.2,  hemoglobin 13.3, hematocrit 39.1 and platelet count 263.  Patient's  preoperative chemistry panel was unremarkable, as well as his  preoperative urinalysis.  Preoperative pro time 12.5 and preoperative  INR 0.9.  Serial CBCs were drawn throughout the patient's hospital stay.  Patient's hemoglobin did drop as low as 9.3.  He did not require  transfusion  through his hospital stay.  Patient's INR was measured at  1.7 by postoperative day 3.  Patient's vital signs remained stable  throughout the stay.  EKG, November 15, 2008, normal EKG within normal  limits, confirmed by Dr. Darrelyn Hillock.   HOSPITAL COURSE:  The patient was admitted to [facility],  taken to the OR, underwent the above-stated procedure without any  complications.  The patient tolerated the procedure well.  Later, the  patient was transferred to the recovery room and then to the orthopedic  floor, started on PCA and p.o. analgesic pain control following surgery.  Postoperative day 1, patient was doing well.  States that he was  evaluated by physical therapy on postoperative day 0.  Postoperative day  2, patient was doing well with therapy.  He was able to ambulate 55 feet  using the walker with minimal assistance.  Patient also able to  transfer, sit to stand, stand to sit with minimal assistance.  Postoperative day 3, patient continues to do well.  Hemoglobin 9.3,  hematocrit 27.1, INR 1.7.  Patient continues to meet  his physical  therapy goals, able to ambulate 250 feet using the walker with some  assistance.  PT notes that gait was slow and steady.  Patient also able  to go up and down the stairs forward using 1 rail.  He was able to go up  and down 5 steps without assistance.  Postoperative day 4, patient  continues to do well.  Pain is completely managed by p.o. analgesics at  this point.  Patient ready to go home today.  [name] is arranged for  home physical therapy, as well as to manage Coumadin.   DISCHARGE PLAN:  1. The patient is discharged to home on November 19, 2008.  2. Discharge diagnoses:  Please see above.  3. Discharge medications:      a.     Benazepril 40 mg 1 tab p.o. daily.      b.     Simvastatin 40 mg 1 tab p.o. daily.      c.     Amlodipine 1 tab p.o. daily.      d.     Chlorthalidone 25 mg 1 tab p.o. daily.      e.     Percocet 10/650 mg 1 to 2  tabs p.o. every 4 to 6 hours       p.r.n. pain.      f.     Robaxin 500 mg 1 tab p.o. t.i.d. p.r.n. muscle spasms.      g.     Coumadin dose per day [facility] with the goal of INR       between 2.0 and 3.0.  Patient is to continue on Coumadin until 3       weeks from the date of surgery.   DIET:  No restrictions.   ACTIVITY:  Patient is on total knee protocol.  He is weightbearing as  tolerated.  [name] has arranged for home health to continue PT, OT,  ADLs, and gait training.  [name] will also continue to monitor  patient's PT/INR and dose of Coumadin.   FOLLOWUP:  Patient to arrange followup for 2 weeks from the date of  surgery with Dr. Darrelyn Hillock.  Please call [phone] to arrange appointment.  Patient also needs to change the dressing daily.  For the first 3 days  while he is home, patient needs to cover with saran wrap so as not to  submerge the incision.  After that, it is okay to leave open, however,  daily dressing changes will be required.   DISPOSITION:  Home.   CONDITION ON DISCHARGE:  Improved.      Rozell Searing, PAC    ______________________________  Georges Lynch Darrelyn Hillock, M.D.    LD/MEDQ  D:  12/11/2008  T:  12/11/2008  Job:  [id number]

## 2010-09-20 ENCOUNTER — Other Ambulatory Visit (INDEPENDENT_AMBULATORY_CARE_PROVIDER_SITE_OTHER): Payer: PRIVATE HEALTH INSURANCE

## 2010-09-20 DIAGNOSIS — Z0000 Encounter for general adult medical examination without abnormal findings: Secondary | ICD-10-CM

## 2010-09-20 LAB — CBC WITH DIFFERENTIAL/PLATELET
Basophils Relative: 0.8 % (ref 0.0–3.0)
Eosinophils Relative: 1.4 % (ref 0.0–5.0)
HCT: 42 % (ref 39.0–52.0)
Hemoglobin: 14.3 g/dL (ref 13.0–17.0)
Lymphs Abs: 1.6 10*3/uL (ref 0.7–4.0)
MCV: 91.5 fl (ref 78.0–100.0)
Monocytes Relative: 10.2 % (ref 3.0–12.0)
Neutro Abs: 4.8 10*3/uL (ref 1.4–7.7)
WBC: 7.3 10*3/uL (ref 4.5–10.5)

## 2010-09-20 LAB — BASIC METABOLIC PANEL
Chloride: 106 mEq/L (ref 96–112)
GFR: 63.25 mL/min (ref >60.00)
Potassium: 4.8 mEq/L (ref 3.5–5.1)
Sodium: 142 mEq/L (ref 135–145)

## 2010-09-20 LAB — POCT URINALYSIS DIPSTICK
Glucose, UA: NEGATIVE
Ketones, UA: NEGATIVE
Protein, UA: NEGATIVE

## 2010-09-20 LAB — LIPID PANEL
Cholesterol: 224 mg/dL — ABNORMAL HIGH (ref 0–200)
Total CHOL/HDL Ratio: 5
VLDL: 57.6 mg/dL — ABNORMAL HIGH (ref 0.0–40.0)

## 2010-09-20 LAB — HEPATIC FUNCTION PANEL
Albumin: 4.7 g/dL (ref 3.5–5.2)
Alkaline Phosphatase: 74 U/L (ref 39–117)
Bilirubin, Direct: 0.1 mg/dL (ref 0.0–0.3)
Total Bilirubin: 0.8 mg/dL (ref 0.3–1.2)

## 2010-09-20 LAB — LDL CHOLESTEROL, DIRECT: Direct LDL: 130.9 mg/dL

## 2010-09-24 ENCOUNTER — Other Ambulatory Visit: Payer: Self-pay | Admitting: Internal Medicine

## 2010-09-25 ENCOUNTER — Encounter: Payer: Self-pay | Admitting: Internal Medicine

## 2010-09-27 ENCOUNTER — Encounter: Payer: Self-pay | Admitting: Internal Medicine

## 2010-09-27 ENCOUNTER — Ambulatory Visit (INDEPENDENT_AMBULATORY_CARE_PROVIDER_SITE_OTHER): Payer: PRIVATE HEALTH INSURANCE | Admitting: Internal Medicine

## 2010-09-27 VITALS — BP 128/80 | HR 84 | Temp 98.0°F | Resp 18 | Ht 70.5 in | Wt 249.0 lb

## 2010-09-27 DIAGNOSIS — Z23 Encounter for immunization: Secondary | ICD-10-CM

## 2010-09-27 DIAGNOSIS — Z0000 Encounter for general adult medical examination without abnormal findings: Secondary | ICD-10-CM

## 2010-09-27 DIAGNOSIS — I10 Essential (primary) hypertension: Secondary | ICD-10-CM

## 2010-09-27 MED ORDER — AMLODIPINE BESYLATE 10 MG PO TABS: 10.0000 mg | ORAL_TABLET | Freq: Every day | ORAL | Status: DC

## 2010-09-27 MED ORDER — SIMVASTATIN 40 MG PO TABS: 40.0000 mg | ORAL_TABLET | Freq: Every day | ORAL | Status: DC

## 2010-09-27 MED ORDER — CHLORTHALIDONE 25 MG PO TABS: 25.0000 mg | ORAL_TABLET | Freq: Every day | ORAL | Status: DC

## 2010-09-27 MED ORDER — BENAZEPRIL HCL 40 MG PO TABS: 40.0000 mg | ORAL_TABLET | Freq: Every day | ORAL | Status: DC

## 2010-09-27 NOTE — Progress Notes (Signed)
  Subjective:    Patient ID: Jerry Beasley, male    DOB: Nov 16, 1945, 65 y.o.   MRN: [id number]  HPI  65 year old patient seen today for followup and for his annual exam. Medical problems include treated hypertension on triple therapy. He continues to do well. He is fairly asymptomatic today but unfortunately continues to gain weight he has osteoarthritis and has had bilateral knee replacement surgeries. Remains on simvastatin 40 mg daily for dyslipidemia. He denies any cardiopulmonary complaints.   Wt Readings from Last 3 Encounters:  09/27/10 249 lb (112.946 kg)  09/21/09 238 lb (107.956 kg)  03/19/09 223 lb (101.152 kg)    Review of Systems  Constitutional: Negative for fever, chills, activity change, appetite change and fatigue.  HENT: Negative for hearing loss, ear pain, congestion, rhinorrhea, sneezing, mouth sores, trouble swallowing, neck pain, neck stiffness, dental problem, voice change, sinus pressure and tinnitus.   Eyes: Negative for photophobia, pain, redness and visual disturbance.  Respiratory: Negative for apnea, cough, choking, chest tightness, shortness of breath and wheezing.   Cardiovascular: Negative for chest pain, palpitations and leg swelling.  Gastrointestinal: Negative for nausea, vomiting, abdominal pain, diarrhea, constipation, blood in stool, abdominal distention, anal bleeding and rectal pain.  Genitourinary: Negative for dysuria, urgency, frequency, hematuria, flank pain, decreased urine volume, discharge, penile swelling, scrotal swelling, difficulty urinating, genital sores and testicular pain.  Musculoskeletal: Negative for myalgias, back pain, joint swelling, arthralgias and gait problem.  Skin: Negative for color change, rash and wound.  Neurological: Negative for dizziness, tremors, seizures, syncope, facial asymmetry, speech difficulty, weakness, light-headedness, numbness and headaches.  Hematological: Negative for adenopathy. Does not bruise/bleed  easily.  Psychiatric/Behavioral: Negative for suicidal ideas, hallucinations, behavioral problems, confusion, sleep disturbance, self-injury, dysphoric mood, decreased concentration and agitation. The patient is not nervous/anxious.        Objective:   Physical Exam  Constitutional: He appears well-developed and well-nourished.       Weight 249. Blood pressure 124/64  HENT:  Head: Normocephalic and atraumatic.  Right Ear: External ear normal.  Left Ear: External ear normal.  Nose: Nose normal.  Mouth/Throat: Oropharynx is clear and moist.  Eyes: Conjunctivae and EOM are normal. Pupils are equal, round, and reactive to light. No scleral icterus.  Neck: Normal range of motion. Neck supple. No JVD present. No thyromegaly present.  Cardiovascular: Regular rhythm, normal heart sounds and intact distal pulses.  Exam reveals no gallop and no friction rub.   No murmur heard. Pulmonary/Chest: Effort normal and breath sounds normal. He exhibits no tenderness.  Abdominal: Soft. Bowel sounds are normal. He exhibits no distension and no mass. There is no tenderness.  Genitourinary: Penis normal. Guaiac negative stool.       +2 enlarged  Musculoskeletal: Normal range of motion. He exhibits no edema and no tenderness.  Lymphadenopathy:    He has no cervical adenopathy.  Neurological: He is alert. He has normal reflexes. No cranial nerve deficit. Coordination normal.  Skin: Skin is warm and dry. No rash noted.  Psychiatric: He has a normal mood and affect. His behavior is normal.          Assessment & Plan:   Annual clinical exam Exercise obesity with weight gain Hypertension well controlled Dyslipidemia  Exercise weight loss encouraged. Present medical regimen unchanged

## 2010-09-27 NOTE — Patient Instructions (Signed)
Limit your sodium (Salt) intake    It is important that you exercise regularly, at least 20 minutes 3 to 4 times per week.  If you develop chest pain or shortness of breath seek  medical attention.  You need to lose weight.  Consider a lower calorie diet and regular exercise.  Return in one year for follow-up 

## 2010-10-10 ENCOUNTER — Other Ambulatory Visit: Payer: Self-pay

## 2010-10-10 NOTE — Telephone Encounter (Signed)
Opened in error

## 2011-03-21 ENCOUNTER — Telehealth: Payer: Self-pay | Admitting: Internal Medicine

## 2011-03-21 NOTE — Telephone Encounter (Signed)
Pt had shingles a few years ago and is interested in getting the shingles shot but would like to know since he has already had it if it would be beneficial to have the shot.please advise

## 2011-03-21 NOTE — Telephone Encounter (Signed)
Attempt to call - VM - LMTCB if futher questions - yes we do encourage shingles vaccine - even if he has alraeady had a case of shingles - no promise that he wont get it again if he get vaccine - we just hope the sx would not be that bad and he would get over it quicker

## 2011-03-21 NOTE — Telephone Encounter (Signed)
Yes, still a CDC recommended vaccine

## 2011-03-21 NOTE — Telephone Encounter (Signed)
Pt requesting more information

## 2011-03-21 NOTE — Telephone Encounter (Signed)
Please call and schedule injection if pt wants - tell him to check with insurance about cost to him before coming in - because it cost [phone] here.

## 2011-09-22 ENCOUNTER — Other Ambulatory Visit: Payer: PRIVATE HEALTH INSURANCE | Admitting: Internal Medicine

## 2011-09-29 ENCOUNTER — Ambulatory Visit (INDEPENDENT_AMBULATORY_CARE_PROVIDER_SITE_OTHER): Payer: Medicare Other | Admitting: Internal Medicine

## 2011-09-29 ENCOUNTER — Encounter: Payer: Self-pay | Admitting: Internal Medicine

## 2011-09-29 VITALS — BP 130/74 | HR 84 | Temp 98.0°F | Resp 18 | Ht 70.0 in | Wt 247.0 lb

## 2011-09-29 DIAGNOSIS — M1990 Unspecified osteoarthritis, unspecified site: Secondary | ICD-10-CM

## 2011-09-29 DIAGNOSIS — N400 Enlarged prostate without lower urinary tract symptoms: Secondary | ICD-10-CM | POA: Diagnosis not present

## 2011-09-29 DIAGNOSIS — E785 Hyperlipidemia, unspecified: Secondary | ICD-10-CM | POA: Diagnosis not present

## 2011-09-29 DIAGNOSIS — Z0000 Encounter for general adult medical examination without abnormal findings: Secondary | ICD-10-CM

## 2011-09-29 DIAGNOSIS — I10 Essential (primary) hypertension: Secondary | ICD-10-CM

## 2011-09-29 DIAGNOSIS — Z136 Encounter for screening for cardiovascular disorders: Secondary | ICD-10-CM | POA: Diagnosis not present

## 2011-09-29 LAB — COMPREHENSIVE METABOLIC PANEL
ALT: 28 U/L (ref 0–53)
Albumin: 4 g/dL (ref 3.5–5.2)
CO2: 22 mEq/L (ref 19–32)
Calcium: 9.1 mg/dL (ref 8.4–10.5)
Chloride: 109 mEq/L (ref 96–112)
GFR: 74.97 mL/min (ref >60.00)
Glucose, Bld: 112 mg/dL — ABNORMAL HIGH (ref 70–99)
Sodium: 139 mEq/L (ref 135–145)
Total Bilirubin: 0.6 mg/dL (ref 0.3–1.2)
Total Protein: 7 g/dL (ref 6.0–8.3)

## 2011-09-29 LAB — CBC WITH DIFFERENTIAL/PLATELET
Basophils Absolute: 0 10*3/uL (ref 0.0–0.1)
Eosinophils Relative: 1.4 % (ref 0.0–5.0)
Lymphocytes Relative: 19.5 % (ref 12.0–46.0)
Lymphs Abs: 1.5 10*3/uL (ref 0.7–4.0)
Monocytes Relative: 8.1 % (ref 3.0–12.0)
Neutrophils Relative %: 70.4 % (ref 43.0–77.0)
Platelets: 251 10*3/uL (ref 150.0–400.0)
RDW: 14.1 % (ref 11.5–14.6)
WBC: 7.5 10*3/uL (ref 4.5–10.5)

## 2011-09-29 LAB — LIPID PANEL
HDL: 43.3 mg/dL (ref >39.00)
Total CHOL/HDL Ratio: 4
Triglycerides: 195 mg/dL — ABNORMAL HIGH (ref 0.0–149.0)

## 2011-09-29 LAB — PSA: PSA: 2.33 ng/mL (ref 0.10–4.00)

## 2011-09-29 LAB — TSH: TSH: 1.22 u[IU]/mL (ref 0.35–5.50)

## 2011-09-29 MED ORDER — SIMVASTATIN 40 MG PO TABS: 40.0000 mg | ORAL_TABLET | Freq: Every day | ORAL | Status: DC

## 2011-09-29 MED ORDER — AMLODIPINE BESYLATE 10 MG PO TABS: 10.0000 mg | ORAL_TABLET | Freq: Every day | ORAL | Status: DC

## 2011-09-29 MED ORDER — BENAZEPRIL HCL 40 MG PO TABS: 40.0000 mg | ORAL_TABLET | Freq: Every day | ORAL | Status: DC

## 2011-09-29 NOTE — Progress Notes (Signed)
Subjective:    Patient ID: Jerry Beasley, male    DOB: 06/22/1945, 66 y.o.   MRN: [id number]  HPI 66 year-old patient seen today for followup and for his annual exam. Medical problems include treated hypertension on triple therapy. He continues to do well. He is fairly asymptomatic today but unfortunately continues to gain weight he has osteoarthritis and has had bilateral knee replacement surgeries. Remains on simvastatin 40 mg daily for dyslipidemia. He denies any cardiopulmonary complaints. For the past 2 weeks he has discontinued diuretic therapy due to photosensitivity. He has recently tired and spending more time at the beach.  Past Medical History  Diagnosis Date  . BENIGN PROSTATIC HYPERTROPHY, MILD, HX OF 10/21/2006  . HYPERLIPIDEMIA 10/21/2006  . HYPERTENSION 10/21/2006  . NEPHROLITHIASIS, HX OF 08/30/2007  . Osteoarth NOS-Unspec 10/21/2006    History   Social History  . Marital Status: Married    Spouse Name: N/A    Number of Children: N/A  . Years of Education: N/A   Occupational History  . Not on file.   Social History Main Topics  . Smoking status: Never Smoker   . Smokeless tobacco: Never Used  . Alcohol Use: Yes     rarely  . Drug Use: No  . Sexually Active: Not on file   Other Topics Concern  . Not on file   Social History Narrative  . No narrative on file    Past Surgical History  Procedure Date  . Knee surgery   . Total knee arthroplasty   . Tonsillectomy     Family History  Problem Relation Age of Onset  . Hypertension Neg Hx     family hx    No Known Allergies  Current Outpatient Prescriptions on File Prior to Visit  Medication Sig Dispense Refill  . amLODipine (NORVASC) 10 MG tablet Take 1 tablet (10 mg total) by mouth daily.  90 tablet  6  . benazepril (LOTENSIN) 40 MG tablet Take 1 tablet (40 mg total) by mouth daily.  90 tablet  6  . chlorthalidone (HYGROTON) 25 MG tablet Take 1 tablet (25 mg total) by mouth daily.  90 tablet  6  .  simvastatin (ZOCOR) 40 MG tablet Take 1 tablet (40 mg total) by mouth daily.  90 tablet  6    BP 130/74  Pulse 84  Temp 98 F (36.7 C) (Oral)  Resp 18  Ht 5\' 10"  (1.778 m)  Wt 247 lb (112.038 kg)  BMI 35.44 kg/m2  SpO2 97%    1. Risk factors, based on past  M,S,F history-  cardiovascular risk factors include hypertension and dyslipidemia  2.  Physical activities: Remains active without exercise limitations has had bilateral total knee replacement surgeries  3.  Depression/mood: No history depression or mood disorder  4.  Hearing: No deficits  5.  ADL's: Independent in all aspects of daily living  6.  Fall risk: Low  7.  Home safety: No problems identified  8.  Height weight, and visual acuity; height and weight stable no change in visual acuity  9.  Counseling: Regular exercise program weight loss all encouraged  10. Lab orders based on risk factors: Lab laboratory update will be reviewed 11. Referral : Not appropriate at this time  12. Care plan: Heart healthy diet and regular exercise weight loss all recommended  13. Cognitive assessment: Alert and oriented with normal affect. No cognitive dysfunction       Wt Readings from Last 3 Encounters:  09/29/11 247 lb (112.038 kg)  09/27/10 249 lb (112.946 kg)  09/21/09 238 lb (107.956 kg)    Review of Systems  Constitutional: Negative for fever, chills, activity change, appetite change and fatigue.  HENT: Negative for hearing loss, ear pain, congestion, rhinorrhea, sneezing, mouth sores, trouble swallowing, neck pain, neck stiffness, dental problem, voice change, sinus pressure and tinnitus.   Eyes: Negative for photophobia, pain, redness and visual disturbance.  Respiratory: Negative for apnea, cough, choking, chest tightness, shortness of breath and wheezing.   Cardiovascular: Negative for chest pain, palpitations and leg swelling.  Gastrointestinal: Negative for nausea, vomiting, abdominal pain, diarrhea,  constipation, blood in stool, abdominal distention, anal bleeding and rectal pain.  Genitourinary: Negative for dysuria, urgency, frequency, hematuria, flank pain, decreased urine volume, discharge, penile swelling, scrotal swelling, difficulty urinating, genital sores and testicular pain.  Musculoskeletal: Negative for myalgias, back pain, joint swelling, arthralgias and gait problem.  Skin: Negative for color change, rash and wound.  Neurological: Negative for dizziness, tremors, seizures, syncope, facial asymmetry, speech difficulty, weakness, light-headedness, numbness and headaches.  Hematological: Negative for adenopathy. Does not bruise/bleed easily.  Psychiatric/Behavioral: Negative for suicidal ideas, hallucinations, behavioral problems, confusion, disturbed wake/sleep cycle, self-injury, dysphoric mood, decreased concentration and agitation. The patient is not nervous/anxious.        Objective:   Physical Exam  Constitutional: He appears well-developed and well-nourished.       Weight 249. Blood pressure 124/64  HENT:  Head: Normocephalic and atraumatic.  Right Ear: External ear normal.  Left Ear: External ear normal.  Nose: Nose normal.  Mouth/Throat: Oropharynx is clear and moist.  Eyes: Conjunctivae and EOM are normal. Pupils are equal, round, and reactive to light. No scleral icterus.  Neck: Normal range of motion. Neck supple. No JVD present. No thyromegaly present.  Cardiovascular: Regular rhythm, normal heart sounds and intact distal pulses.  Exam reveals no gallop and no friction rub.   No murmur heard. Pulmonary/Chest: Effort normal and breath sounds normal. He exhibits no tenderness.  Abdominal: Soft. Bowel sounds are normal. He exhibits no distension and no mass. There is no tenderness.  Genitourinary: Prostate normal and penis normal. Guaiac negative stool.       +2 enlarged  Musculoskeletal: Normal range of motion. He exhibits no edema and no tenderness.    Lymphadenopathy:    He has no cervical adenopathy.  Neurological: He is alert. He has normal reflexes. No cranial nerve deficit. Coordination normal.  Skin: Skin is warm and dry. No rash noted.  Psychiatric: He has a normal mood and affect. His behavior is normal.          Assessment & Plan:   Annual clinical exam  Hypertension well controlled Dyslipidemia  Exercise weight loss encouraged. Present medical regimen unchanged except diuretic therapy will be discontinued. He'll monitor home blood pressure readings were carefully. Lifestyle issues discussed. Laboratory update reviewed. Recheck 1 year

## 2011-09-29 NOTE — Patient Instructions (Signed)
Please check your blood pressure on a regular basis.  If it is consistently greater than 150/90, please make an office appointment.  Limit your sodium (Salt) intake    It is important that you exercise regularly, at least 20 minutes 3 to 4 times per week.  If you develop chest pain or shortness of breath seek  medical attention.  Return in one year for follow-up  You need to lose weight.  Consider a lower calorie diet and regular exercise.DASH Diet The DASH diet stands for "Dietary Approaches to Stop Hypertension." It is a healthy eating plan that has been shown to reduce high blood pressure (hypertension) in as little as 14 days, while also possibly providing other significant health benefits. These other health benefits include reducing the risk of breast cancer after menopause and reducing the risk of type 2 diabetes, heart disease, colon cancer, and stroke. Health benefits also include weight loss and slowing kidney failure in patients with chronic kidney disease.   DIET GUIDELINES  Limit salt (sodium). Your diet should contain less than 1500 mg of sodium daily.   Limit refined or processed carbohydrates. Your diet should include mostly whole grains. Desserts and added sugars should be used sparingly.   Include small amounts of heart-healthy fats. These types of fats include nuts, oils, and tub margarine. Limit saturated and trans fats. These fats have been shown to be harmful in the body.  CHOOSING FOODS   The following food groups are based on a 2000 calorie diet. See your Registered Dietitian for individual calorie needs. Grains and Grain Products (6 to 8 servings daily)  Eat More Often: Whole-wheat bread, brown rice, whole-grain or wheat pasta, quinoa, popcorn without added fat or salt (air popped).   Eat Less Often: White bread, white pasta, white rice, cornbread.  Vegetables (4 to 5 servings daily)  Eat More Often: Fresh, frozen, and canned vegetables. Vegetables may be raw,  steamed, roasted, or grilled with a minimal amount of fat.   Eat Less Often/Avoid: Creamed or fried vegetables. Vegetables in a cheese sauce.  Fruit (4 to 5 servings daily)  Eat More Often: All fresh, canned (in natural juice), or frozen fruits. Dried fruits without added sugar. One hundred percent fruit juice ( cup [237 mL] daily).   Eat Less Often: Dried fruits with added sugar. Canned fruit in light or heavy syrup.  [employer], Fish, and Poultry (2 servings or less daily. One serving is 3 to 4 oz [85-114 g]).  Eat More Often: Ninety percent or leaner ground beef, tenderloin, sirloin. Round cuts of beef, chicken breast, [place] breast. All fish. Grill, bake, or broil your meat. Nothing should be fried.   Eat Less Often/Avoid: Fatty cuts of meat, [place], or chicken leg, thigh, or wing. Fried cuts of meat or fish.  Dairy (2 to 3 servings)  Eat More Often: Low-fat or fat-free milk, low-fat plain or light yogurt, reduced-fat or part-skim cheese.   Eat Less Often/Avoid: Milk (whole, 2%, skim, or chocolate). Whole milk yogurt. Full-fat cheeses.  Nuts, Seeds, and Legumes (4 to 5 servings per week)  Eat More Often: All without added salt.   Eat Less Often/Avoid: Salted nuts and seeds, canned beans with added salt.  Fats and Sweets (limited)  Eat More Often: Vegetable oils, tub margarines without trans fats, sugar-free gelatin. Mayonnaise and salad dressings.   Eat Less Often/Avoid: Coconut oils, palm oils, butter, stick margarine, cream, half and half, cookies, candy, pie.  FOR MORE INFORMATION The Dash Diet  Eating Plan: www.dashdiet.org Document Released: 03/20/2011 Document Reviewed: 03/10/2011 [facility] Patient Information 2012 [place], [place].

## 2011-09-29 NOTE — Progress Notes (Signed)
  Subjective:    Patient ID: Jerry Beasley, male    DOB: 1946-01-28, 66 y.o.   MRN: [id number]  HPI    Review of Systems     Objective:   Physical Exam        Assessment & Plan:

## 2011-10-13 ENCOUNTER — Other Ambulatory Visit: Payer: Self-pay | Admitting: Internal Medicine

## 2012-01-07 ENCOUNTER — Ambulatory Visit (INDEPENDENT_AMBULATORY_CARE_PROVIDER_SITE_OTHER): Payer: Medicare Other

## 2012-01-07 DIAGNOSIS — Z23 Encounter for immunization: Secondary | ICD-10-CM | POA: Diagnosis not present

## 2012-02-16 DIAGNOSIS — J209 Acute bronchitis, unspecified: Secondary | ICD-10-CM | POA: Diagnosis not present

## 2012-09-29 ENCOUNTER — Encounter: Payer: Self-pay | Admitting: Internal Medicine

## 2012-09-29 ENCOUNTER — Other Ambulatory Visit: Payer: Self-pay | Admitting: Internal Medicine

## 2012-09-29 ENCOUNTER — Ambulatory Visit (INDEPENDENT_AMBULATORY_CARE_PROVIDER_SITE_OTHER): Payer: Medicare Other | Admitting: Internal Medicine

## 2012-09-29 VITALS — BP 150/80 | HR 84 | Temp 98.6°F | Resp 20 | Ht 70.0 in | Wt 241.0 lb

## 2012-09-29 DIAGNOSIS — Z87442 Personal history of urinary calculi: Secondary | ICD-10-CM

## 2012-09-29 DIAGNOSIS — Z0000 Encounter for general adult medical examination without abnormal findings: Secondary | ICD-10-CM

## 2012-09-29 DIAGNOSIS — I10 Essential (primary) hypertension: Secondary | ICD-10-CM | POA: Diagnosis not present

## 2012-09-29 DIAGNOSIS — E785 Hyperlipidemia, unspecified: Secondary | ICD-10-CM

## 2012-09-29 DIAGNOSIS — M1990 Unspecified osteoarthritis, unspecified site: Secondary | ICD-10-CM

## 2012-09-29 DIAGNOSIS — R7309 Other abnormal glucose: Secondary | ICD-10-CM

## 2012-09-29 DIAGNOSIS — R7302 Impaired glucose tolerance (oral): Secondary | ICD-10-CM | POA: Insufficient documentation

## 2012-09-29 LAB — COMPREHENSIVE METABOLIC PANEL
AST: 26 U/L (ref 0–37)
Albumin: 4.2 g/dL (ref 3.5–5.2)
Alkaline Phosphatase: 74 U/L (ref 39–117)
BUN: 23 mg/dL (ref 6–23)
Potassium: 3.8 mEq/L (ref 3.5–5.1)
Sodium: 140 mEq/L (ref 135–145)

## 2012-09-29 LAB — CBC WITH DIFFERENTIAL/PLATELET
Basophils Relative: 0.7 % (ref 0.0–3.0)
Eosinophils Absolute: 0.2 10*3/uL (ref 0.0–0.7)
Lymphs Abs: 1.8 10*3/uL (ref 0.7–4.0)
MCHC: 34 g/dL (ref 30.0–36.0)
MCV: 90.7 fl (ref 78.0–100.0)
Monocytes Absolute: 0.9 10*3/uL (ref 0.1–1.0)
Neutro Abs: 5 10*3/uL (ref 1.4–7.7)
Neutrophils Relative %: 62.9 % (ref 43.0–77.0)
RBC: 4.58 Mil/uL (ref 4.22–5.81)

## 2012-09-29 NOTE — Progress Notes (Signed)
Patient ID: DANTRE YEARWOOD, male   DOB: 16-Mar-1946, 67 y.o.   MRN: [id number]  Subjective:    Patient ID: GIOVONNIE TRETTEL, male    DOB: February 08, 1946, 67 y.o.   MRN: [id number]  HPI  67 year-old patient seen today for followup and for his annual exam. Medical problems include treated hypertension on triple therapy. He continues to do well. He is fairly asymptomatic today ;  he has osteoarthritis and has had bilateral knee replacement surgeries. Remains on simvastatin 40 mg daily for dyslipidemia. He denies any cardiopulmonary complaints.  Alcohol-Tobacco  Smoking Status: never   Allergies (verified):  No Known Drug Allergies   Past History:  Past Medical History:  Hyperlipidemia  Hypertension  Nephrolithiasis, hx of calcium oxalate stones  Benign prostatic hypertrophy  Osteoarthritis  slp R TKR surgery 04-2008  s/ L TKR 11-2008   Past Surgical History:  Arthroscopic Rt knee sx x 05-1985,2005  Tonsillectomy  Colonoscopy-05/16/2005  left total knee replacement surgery, January 2010  right total knee replacement surgery in September 2010     Family History:  Reviewed history from 08/30/2007 and no changes required.   Family History Hypertension  father died age 43, aortic aneurysm rupture  mother is in good health  Age 92  One brother two sisters positive for hypertension   Social History:  Reviewed history from 08/30/2007 and no changes required.  Married  two adult childrenSmoking Status: never   1. Risk factors, based on past  M,S,F history-  cardiovascular risk factors include hypertension and dyslipidemia  2.  Physical activities: Remains quite active physically without limitations  3.  Depression/mood: No history depression or mood disorder  4.  Hearing: No deficits  5.  ADL's: Independent in all aspects of daily living  6.  Fall risk: Low  7.  Home safety: No problems identified  8.  Height weight, and visual acuity; height and weight stable no change in visual  acuity  9.  Counseling: Heart healthy diet record exercise and modest weight loss all encouraged  10. Lab orders based on risk factors: Laboratory profile be reviewed  11. Referral : Not appropriate at this time  12. Care plan: Weight loss exercise regimen discussed.  13. Cognitive assessment: Alert and oriented with normal affect. No cognitive dysfunction       Wt Readings from Last 3 Encounters:  09/29/12 241 lb (109.317 kg)  09/29/11 247 lb (112.038 kg)  09/27/10 249 lb (112.946 kg)    Review of Systems  Constitutional: Negative for fever, chills, activity change, appetite change and fatigue.  HENT: Negative for hearing loss, ear pain, congestion, rhinorrhea, sneezing, mouth sores, trouble swallowing, neck pain, neck stiffness, dental problem, voice change, sinus pressure and tinnitus.   Eyes: Negative for photophobia, pain, redness and visual disturbance.  Respiratory: Negative for apnea, cough, choking, chest tightness, shortness of breath and wheezing.   Cardiovascular: Negative for chest pain, palpitations and leg swelling.  Gastrointestinal: Negative for nausea, vomiting, abdominal pain, diarrhea, constipation, blood in stool, abdominal distention, anal bleeding and rectal pain.  Genitourinary: Negative for dysuria, urgency, frequency, hematuria, flank pain, decreased urine volume, discharge, penile swelling, scrotal swelling, difficulty urinating, genital sores and testicular pain.  Musculoskeletal: Negative for myalgias, back pain, joint swelling, arthralgias and gait problem.  Skin: Negative for color change, rash and wound.  Neurological: Negative for dizziness, tremors, seizures, syncope, facial asymmetry, speech difficulty, weakness, light-headedness, numbness and headaches.  Hematological: Negative for adenopathy. Does not bruise/bleed easily.  Psychiatric/Behavioral: Negative  for suicidal ideas, hallucinations, behavioral problems, confusion, sleep disturbance,  self-injury, dysphoric mood, decreased concentration and agitation. The patient is not nervous/anxious.        Objective:   Physical Exam  Constitutional: He appears well-developed and well-nourished.  Weight 241. Blood pressure 124/64  HENT:  Head: Normocephalic and atraumatic.  Right Ear: External ear normal.  Left Ear: External ear normal.  Nose: Nose normal.  Mouth/Throat: Oropharynx is clear and moist.  Eyes: Conjunctivae and EOM are normal. Pupils are equal, round, and reactive to light. No scleral icterus.  Neck: Normal range of motion. Neck supple. No JVD present. No thyromegaly present.  Cardiovascular: Regular rhythm, normal heart sounds and intact distal pulses.  Exam reveals no gallop and no friction rub.   No murmur heard. Pulmonary/Chest: Effort normal and breath sounds normal. He exhibits no tenderness.  Abdominal: Soft. Bowel sounds are normal. He exhibits no distension and no mass. There is no tenderness.  Genitourinary: Penis normal. Guaiac negative stool.  +2 enlarged  Musculoskeletal: Normal range of motion. He exhibits no edema and no tenderness.  Lymphadenopathy:    He has no cervical adenopathy.  Neurological: He is alert. He has normal reflexes. No cranial nerve deficit. Coordination normal.  Skin: Skin is warm and dry. No rash noted.  Psychiatric: He has a normal mood and affect. His behavior is normal.          Assessment & Plan:   Annual clinical exam Exogenous obesity Hypertension well controlled Dyslipidemia IGT  Exercise weight loss encouraged. Present medical regimen unchanged

## 2012-09-29 NOTE — Patient Instructions (Signed)
Limit your sodium (Salt) intake    It is important that you exercise regularly, at least 20 minutes 3 to 4 times per week.  If you develop chest pain or shortness of breath seek  medical attention.  You need to lose weight.  Consider a lower calorie diet and regular exercise.  Return in one year for follow-up 

## 2012-10-08 ENCOUNTER — Other Ambulatory Visit: Payer: Self-pay | Admitting: Internal Medicine

## 2012-11-29 ENCOUNTER — Other Ambulatory Visit: Payer: Self-pay | Admitting: Internal Medicine

## 2012-12-21 DIAGNOSIS — H2510 Age-related nuclear cataract, unspecified eye: Secondary | ICD-10-CM | POA: Diagnosis not present

## 2013-01-14 ENCOUNTER — Ambulatory Visit (INDEPENDENT_AMBULATORY_CARE_PROVIDER_SITE_OTHER): Payer: Medicare Other

## 2013-01-14 DIAGNOSIS — Z23 Encounter for immunization: Secondary | ICD-10-CM | POA: Diagnosis not present

## 2013-09-29 ENCOUNTER — Ambulatory Visit (INDEPENDENT_AMBULATORY_CARE_PROVIDER_SITE_OTHER): Payer: Medicare Other | Admitting: Internal Medicine

## 2013-09-29 ENCOUNTER — Ambulatory Visit: Payer: Medicare Other | Admitting: Internal Medicine

## 2013-09-29 ENCOUNTER — Encounter: Payer: Self-pay | Admitting: Internal Medicine

## 2013-09-29 VITALS — BP 136/80 | HR 88 | Temp 98.2°F | Resp 20 | Ht 69.5 in | Wt 249.0 lb

## 2013-09-29 DIAGNOSIS — M1990 Unspecified osteoarthritis, unspecified site: Secondary | ICD-10-CM | POA: Diagnosis not present

## 2013-09-29 DIAGNOSIS — R7309 Other abnormal glucose: Secondary | ICD-10-CM

## 2013-09-29 DIAGNOSIS — Z87442 Personal history of urinary calculi: Secondary | ICD-10-CM

## 2013-09-29 DIAGNOSIS — Z23 Encounter for immunization: Secondary | ICD-10-CM | POA: Diagnosis not present

## 2013-09-29 DIAGNOSIS — Z87898 Personal history of other specified conditions: Secondary | ICD-10-CM

## 2013-09-29 DIAGNOSIS — I10 Essential (primary) hypertension: Secondary | ICD-10-CM

## 2013-09-29 DIAGNOSIS — Z0000 Encounter for general adult medical examination without abnormal findings: Secondary | ICD-10-CM

## 2013-09-29 DIAGNOSIS — E785 Hyperlipidemia, unspecified: Secondary | ICD-10-CM

## 2013-09-29 DIAGNOSIS — R7302 Impaired glucose tolerance (oral): Secondary | ICD-10-CM

## 2013-09-29 LAB — CBC WITH DIFFERENTIAL/PLATELET
BASOS PCT: 0.6 % (ref 0.0–3.0)
Basophils Absolute: 0 10*3/uL (ref 0.0–0.1)
EOS ABS: 0.2 10*3/uL (ref 0.0–0.7)
Eosinophils Relative: 1.8 % (ref 0.0–5.0)
HCT: 45.4 % (ref 39.0–52.0)
Hemoglobin: 15.2 g/dL (ref 13.0–17.0)
LYMPHS PCT: 24.5 % (ref 12.0–46.0)
Lymphs Abs: 2.1 10*3/uL (ref 0.7–4.0)
MCHC: 33.5 g/dL (ref 30.0–36.0)
MCV: 90.9 fl (ref 78.0–100.0)
Monocytes Absolute: 0.8 10*3/uL (ref 0.1–1.0)
Monocytes Relative: 8.8 % (ref 3.0–12.0)
NEUTROS PCT: 64.3 % (ref 43.0–77.0)
Neutro Abs: 5.6 10*3/uL (ref 1.4–7.7)
Platelets: 294 10*3/uL (ref 150.0–400.0)
RBC: 4.99 Mil/uL (ref 4.22–5.81)
RDW: 13.8 % (ref 11.5–15.5)
WBC: 8.7 10*3/uL (ref 4.0–10.5)

## 2013-09-29 LAB — COMPREHENSIVE METABOLIC PANEL
ALT: 31 U/L (ref 0–53)
AST: 24 U/L (ref 0–37)
Albumin: 4.4 g/dL (ref 3.5–5.2)
Alkaline Phosphatase: 81 U/L (ref 39–117)
BUN: 22 mg/dL (ref 6–23)
CHLORIDE: 105 meq/L (ref 96–112)
CO2: 27 meq/L (ref 19–32)
CREATININE: 1.1 mg/dL (ref 0.4–1.5)
Calcium: 9.5 mg/dL (ref 8.4–10.5)
GFR: 69.89 mL/min (ref >60.00)
GLUCOSE: 130 mg/dL — AB (ref 70–99)
Potassium: 4.3 mEq/L (ref 3.5–5.1)
Sodium: 139 mEq/L (ref 135–145)
Total Bilirubin: 0.7 mg/dL (ref 0.2–1.2)
Total Protein: 7.4 g/dL (ref 6.0–8.3)

## 2013-09-29 LAB — LIPID PANEL
Cholesterol: 195 mg/dL (ref 0–200)
HDL: 46.2 mg/dL (ref >39.00)
LDL Cholesterol: 98 mg/dL (ref 0–99)
NonHDL: 148.8
TRIGLYCERIDES: 253 mg/dL — AB (ref 0.0–149.0)
Total CHOL/HDL Ratio: 4
VLDL: 50.6 mg/dL — ABNORMAL HIGH (ref 0.0–40.0)

## 2013-09-29 LAB — HEMOGLOBIN A1C: HEMOGLOBIN A1C: 6.4 % (ref 4.6–6.5)

## 2013-09-29 LAB — TSH: TSH: 0.75 u[IU]/mL (ref 0.35–4.50)

## 2013-09-29 NOTE — Patient Instructions (Signed)
It is important that you exercise regularly, at least 20 minutes 3 to 4 times per week.  If you develop chest pain or shortness of breath seek  medical attention.  You need to lose weight.  Consider a lower calorie diet and regular exercise.  Return in one year for follow-up   

## 2013-09-29 NOTE — Progress Notes (Signed)
Patient ID: Jerry Beasley, male   DOB: 11/27/1945, 68 y.o.   MRN: [id number]  Subjective:    Patient ID: Jerry Beasley, male    DOB: 07/04/1945, 68 y.o.   MRN: [id number]  HPI  Wt Readings from Last 3 Encounters:  09/29/13 249 lb (112.946 kg)  09/29/12 241 lb (109.317 kg)  09/29/11 247 lb (112.038 kg)   [age]year-old patient seen today for followup and for his annual exam.   Medical problems include treated hypertension on triple therapy. He continues to do well. He is fairly asymptomatic today ;  he has osteoarthritis and has had bilateral knee replacement surgeries. Remains on simvastatin 40 mg daily for dyslipidemia. He denies any cardiopulmonary complaints.  Alcohol-Tobacco  Smoking Status: never   Allergies (verified):  No Known Drug Allergies   Past History:   Hyperlipidemia  Hypertension  Nephrolithiasis, hx of calcium oxalate stones  Benign prostatic hypertrophy  Osteoarthritis  slp R TKR surgery 04-2008  s/ L TKR 11-2008   Past Surgical History:  Arthroscopic Rt knee sx x 05-1985,2005  Tonsillectomy  Colonoscopy-05/16/2005  left total knee replacement surgery, January 2010  right total knee replacement surgery in September 2010     Family History:    Family History Hypertension  father died age 69, aortic aneurysm rupture  mother is in good health  Age 69  One brother two sisters positive for hypertension   Social History:   Married  two adult childrenSmoking Status: never   Medicare wellness visit  1. Risk factors, based on past  M,S,F history-  cardiovascular risk factors include hypertension and dyslipidemia  2.  Physical activities: Remains quite active physically without limitations  3.  Depression/mood: No history depression or mood disorder  4.  Hearing: No deficits  5.  ADL's: Independent in all aspects of daily living  6.  Fall risk: Low  7.  Home safety: No problems identified  8.  Height weight, and visual acuity; height and weight  stable no change in visual acuity  9.  Counseling: Heart healthy diet record exercise and modest weight loss all encouraged  10. Lab orders based on risk factors: Laboratory profile be reviewed  11. Referral : Not appropriate at this time  12. Care plan: Weight loss exercise regimen discussed.  13. Cognitive assessment: Alert and oriented with normal affect. No cognitive dysfunction       Wt Readings from Last 3 Encounters:  09/29/13 249 lb (112.946 kg)  09/29/12 241 lb (109.317 kg)  09/29/11 247 lb (112.038 kg)    Review of Systems  Constitutional: Negative for fever, chills, activity change, appetite change and fatigue.  HENT: Negative for congestion, dental problem, ear pain, hearing loss, mouth sores, rhinorrhea, sinus pressure, sneezing, tinnitus, trouble swallowing and voice change.   Eyes: Negative for photophobia, pain, redness and visual disturbance.  Respiratory: Negative for apnea, cough, choking, chest tightness, shortness of breath and wheezing.   Cardiovascular: Negative for chest pain, palpitations and leg swelling.  Gastrointestinal: Negative for nausea, vomiting, abdominal pain, diarrhea, constipation, blood in stool, abdominal distention, anal bleeding and rectal pain.  Genitourinary: Negative for dysuria, urgency, frequency, hematuria, flank pain, decreased urine volume, discharge, penile swelling, scrotal swelling, difficulty urinating, genital sores and testicular pain.  Musculoskeletal: Negative for arthralgias, back pain, gait problem, joint swelling, myalgias, neck pain and neck stiffness.  Skin: Negative for color change, rash and wound.  Neurological: Negative for dizziness, tremors, seizures, syncope, facial asymmetry, speech difficulty, weakness, light-headedness, numbness and headaches.  Hematological: Negative for adenopathy. Does not bruise/bleed easily.  Psychiatric/Behavioral: Negative for suicidal ideas, hallucinations, behavioral problems,  confusion, sleep disturbance, self-injury, dysphoric mood, decreased concentration and agitation. The patient is not nervous/anxious.        Objective:   Physical Exam  Constitutional: He appears well-developed and well-nourished.  Weight 241. Blood pressure 124/64  HENT:  Head: Normocephalic and atraumatic.  Right Ear: External ear normal.  Left Ear: External ear normal.  Nose: Nose normal.  Mouth/Throat: Oropharynx is clear and moist.  Low hanging soft palate  Left cerumen impaction  Eyes: Conjunctivae and EOM are normal. Pupils are equal, round, and reactive to light. No scleral icterus.  Neck: Normal range of motion. Neck supple. No JVD present. No thyromegaly present.  Cardiovascular: Regular rhythm, normal heart sounds and intact distal pulses.  Exam reveals no gallop and no friction rub.   No murmur heard. Pulmonary/Chest: Effort normal and breath sounds normal. He exhibits no tenderness.  Abdominal: Soft. Bowel sounds are normal. He exhibits no distension and no mass. There is no tenderness.  Genitourinary: Penis normal. Guaiac negative stool.  +2 enlarged  Musculoskeletal: Normal range of motion. He exhibits no edema and no tenderness.  Decreased vibratory sensation distally  Lymphadenopathy:    He has no cervical adenopathy.  Neurological: He is alert. He has normal reflexes. No cranial nerve deficit. Coordination normal.  Skin: Skin is warm and dry. No rash noted.  Psychiatric: He has a normal mood and affect. His behavior is normal.          Assessment & Plan:   Annual clinical exam Exogenous obesity Hypertension well controlled Dyslipidemia IGT Paresthesias of toes.  Possible early neuropathy.  Will check hemoglobin A1c with screening lab Cerumen impaction.  We'll irrigate until clear  Exercise weight loss encouraged. Present medical regimen unchanged

## 2013-09-29 NOTE — Progress Notes (Signed)
Pre-visit discussion using our clinic review tool. No additional management support is needed unless otherwise documented below in the visit note.  

## 2013-09-30 ENCOUNTER — Telehealth: Payer: Self-pay | Admitting: Internal Medicine

## 2013-09-30 NOTE — Telephone Encounter (Signed)
Relevant patient education mailed to patient.  

## 2013-10-04 ENCOUNTER — Other Ambulatory Visit: Payer: Self-pay | Admitting: Family Medicine

## 2013-10-24 ENCOUNTER — Other Ambulatory Visit: Payer: Self-pay | Admitting: Internal Medicine

## 2013-12-08 ENCOUNTER — Other Ambulatory Visit: Payer: Self-pay | Admitting: Internal Medicine

## 2014-03-01 ENCOUNTER — Ambulatory Visit: Payer: Medicare Other

## 2014-03-01 ENCOUNTER — Ambulatory Visit (INDEPENDENT_AMBULATORY_CARE_PROVIDER_SITE_OTHER): Payer: Medicare Other

## 2014-03-01 DIAGNOSIS — Z23 Encounter for immunization: Secondary | ICD-10-CM | POA: Diagnosis not present

## 2014-09-29 ENCOUNTER — Encounter: Payer: Self-pay | Admitting: Internal Medicine

## 2014-09-29 ENCOUNTER — Ambulatory Visit (INDEPENDENT_AMBULATORY_CARE_PROVIDER_SITE_OTHER): Payer: Medicare Other | Admitting: Internal Medicine

## 2014-09-29 VITALS — BP 126/78 | HR 91 | Temp 98.4°F | Resp 20 | Ht 69.5 in | Wt 247.0 lb

## 2014-09-29 DIAGNOSIS — R7302 Impaired glucose tolerance (oral): Secondary | ICD-10-CM

## 2014-09-29 DIAGNOSIS — Z23 Encounter for immunization: Secondary | ICD-10-CM | POA: Diagnosis not present

## 2014-09-29 DIAGNOSIS — E785 Hyperlipidemia, unspecified: Secondary | ICD-10-CM | POA: Diagnosis not present

## 2014-09-29 DIAGNOSIS — M150 Primary generalized (osteo)arthritis: Secondary | ICD-10-CM

## 2014-09-29 DIAGNOSIS — M159 Polyosteoarthritis, unspecified: Secondary | ICD-10-CM

## 2014-09-29 DIAGNOSIS — I10 Essential (primary) hypertension: Secondary | ICD-10-CM

## 2014-09-29 LAB — COMPREHENSIVE METABOLIC PANEL
ALBUMIN: 4.4 g/dL (ref 3.5–5.2)
ALK PHOS: 85 U/L (ref 39–117)
ALT: 26 U/L (ref 0–53)
AST: 21 U/L (ref 0–37)
BUN: 22 mg/dL (ref 6–23)
CO2: 27 mEq/L (ref 19–32)
Calcium: 9.5 mg/dL (ref 8.4–10.5)
Chloride: 102 mEq/L (ref 96–112)
Creatinine, Ser: 1.03 mg/dL (ref 0.40–1.50)
GFR: 75.97 mL/min (ref >60.00)
Glucose, Bld: 139 mg/dL — ABNORMAL HIGH (ref 70–99)
Potassium: 4.2 mEq/L (ref 3.5–5.1)
Sodium: 137 mEq/L (ref 135–145)
Total Bilirubin: 0.7 mg/dL (ref 0.2–1.2)
Total Protein: 7.2 g/dL (ref 6.0–8.3)

## 2014-09-29 LAB — CBC WITH DIFFERENTIAL/PLATELET
BASOS ABS: 0.1 10*3/uL (ref 0.0–0.1)
BASOS PCT: 0.9 % (ref 0.0–3.0)
Eosinophils Absolute: 0.3 10*3/uL (ref 0.0–0.7)
Eosinophils Relative: 3.4 % (ref 0.0–5.0)
HEMATOCRIT: 44.5 % (ref 39.0–52.0)
HEMOGLOBIN: 15 g/dL (ref 13.0–17.0)
LYMPHS ABS: 2 10*3/uL (ref 0.7–4.0)
Lymphocytes Relative: 23.7 % (ref 12.0–46.0)
MCHC: 33.7 g/dL (ref 30.0–36.0)
MCV: 90.2 fl (ref 78.0–100.0)
Monocytes Absolute: 0.7 10*3/uL (ref 0.1–1.0)
Monocytes Relative: 8 % (ref 3.0–12.0)
NEUTROS ABS: 5.5 10*3/uL (ref 1.4–7.7)
Neutrophils Relative %: 64 % (ref 43.0–77.0)
Platelets: 263 10*3/uL (ref 150.0–400.0)
RBC: 4.94 Mil/uL (ref 4.22–5.81)
RDW: 13.8 % (ref 11.5–15.5)
WBC: 8.5 10*3/uL (ref 4.0–10.5)

## 2014-09-29 LAB — TSH: TSH: 1.17 u[IU]/mL (ref 0.35–4.50)

## 2014-09-29 LAB — LIPID PANEL
CHOL/HDL RATIO: 4
CHOLESTEROL: 178 mg/dL (ref 0–200)
HDL: 41.5 mg/dL (ref >39.00)
NONHDL: 136.5
TRIGLYCERIDES: 285 mg/dL — AB (ref 0.0–149.0)
VLDL: 57 mg/dL — AB (ref 0.0–40.0)

## 2014-09-29 LAB — LDL CHOLESTEROL, DIRECT: LDL DIRECT: 87 mg/dL

## 2014-09-29 LAB — HEMOGLOBIN A1C: Hgb A1c MFr Bld: 6.4 % (ref 4.6–6.5)

## 2014-09-29 NOTE — Progress Notes (Signed)
Subjective:    Patient ID: Jerry Beasley, male    DOB: [birth year], 69 y.o.   MRN: [id number]  HPI  Wt Readings from Last 3 Encounters:  09/29/14 247 lb (112.038 kg)  09/29/13 249 lb (112.946 kg)  09/29/12 241 lb (109.35 kg)   69 year old patient who is seen today for his annual follow-up.  He has a history of hypertension, dyslipidemia and osteoarthritis.  He also has a history of impaired glucose tolerance.  He continues to complain of numbness and tingling of his feet which is fairly mild.  This was his chief complaint one year ago.  Hemoglobin A1c at that time was 6.4.  His weight is about the same.  In spite of efforts at weight loss.  He is tolerating his medications well.  No other concerns or complaints.  No cardiac, or complaints.  Lab Results  Component Value Date   HGBA1C 6.4 09/29/2013    Past Medical History  Diagnosis Date  . BENIGN PROSTATIC HYPERTROPHY, MILD, HX OF 10/21/2006  . HYPERLIPIDEMIA 10/21/2006  . HYPERTENSION 10/21/2006  . NEPHROLITHIASIS, HX OF 08/30/2007  . Osteoarth NOS-Unspec 10/21/2006    History   Social History  . Marital Status: Married    Spouse Name: N/A  . Number of Children: N/A  . Years of Education: N/A   Occupational History  . Not on file.   Social History Main Topics  . Smoking status: Never Smoker   . Smokeless tobacco: Never Used  . Alcohol Use: Yes     Comment: rarely  . Drug Use: No  . Sexual Activity: Not on file   Other Topics Concern  . Not on file   Social History Narrative    Past Surgical History  Procedure Laterality Date  . Knee surgery    . Total knee arthroplasty    . Tonsillectomy      Family History  Problem Relation Age of Onset  . Hypertension Neg Hx     family hx    No Known Allergies  Current Outpatient Prescriptions on File Prior to Visit  Medication Sig Dispense Refill  . amLODipine (NORVASC) 10 MG tablet TAKE ONE TABLET BY MOUTH ONCE DAILY 90 tablet 3  . aspirin 81 MG tablet Take 81 mg  by mouth daily.    . benazepril (LOTENSIN) 40 MG tablet TAKE ONE TABLET BY MOUTH ONCE DAILY 90 tablet 2  . Multiple Vitamin (MULTI-VITAMIN DAILY PO) Take 1 tablet by mouth daily.    . simvastatin (ZOCOR) 40 MG tablet TAKE ONE TABLET BY MOUTH ONCE DAILY 90 tablet 3   No current facility-administered medications on file prior to visit.    BP 126/78 mmHg  Pulse 91  Temp(Src) 98.4 F (36.9 C) (Oral)  Resp 20  Ht 5' 9.5" (1.765 m)  Wt 247 lb (112.038 kg)  BMI 35.96 kg/m2  SpO2 96%     Review of Systems  Constitutional: Negative for fever, chills, appetite change and fatigue.  HENT: Negative for congestion, dental problem, ear pain, hearing loss, sore throat, tinnitus, trouble swallowing and voice change.   Eyes: Negative for pain, discharge and visual disturbance.  Respiratory: Negative for cough, chest tightness, wheezing and stridor.   Cardiovascular: Negative for chest pain, palpitations and leg swelling.  Gastrointestinal: Negative for nausea, vomiting, abdominal pain, diarrhea, constipation, blood in stool and abdominal distention.  Genitourinary: Negative for urgency, hematuria, flank pain, discharge, difficulty urinating and genital sores.  Musculoskeletal: Negative for myalgias, back pain, joint swelling, arthralgias,  gait problem and neck stiffness.  Skin: Negative for rash.  Neurological: Positive for numbness. Negative for dizziness, syncope, speech difficulty, weakness and headaches.  Hematological: Negative for adenopathy. Does not bruise/bleed easily.  Psychiatric/Behavioral: Negative for behavioral problems and dysphoric mood. The patient is not nervous/anxious.        Objective:   Physical Exam  Constitutional: He is oriented to person, place, and time. He appears well-developed.  HENT:  Head: Normocephalic.  Right Ear: External ear normal.  Left Ear: External ear normal.  Eyes: Conjunctivae and EOM are normal.  Neck: Normal range of motion.  Cardiovascular:  Normal rate and normal heart sounds.   Pulmonary/Chest: Breath sounds normal.  Abdominal: Bowel sounds are normal.  Musculoskeletal: Normal range of motion. He exhibits no edema or tenderness.  Neurological: He is alert and oriented to person, place, and time.  Psychiatric: He has a normal mood and affect. His behavior is normal.          Assessment & Plan:   Hypertension, well-controlled.  We'll continue present regimen Impaired glucose tolerance.  We'll check a hemoglobin A1c Dyslipidemia.  Will review a lipid profile Osteoarthritis, stable Nephrolithiasis, stable History mild BPH.  CPX without lab 6 months We'll check lab update today

## 2014-09-29 NOTE — Patient Instructions (Signed)
Limit your sodium (Salt) intake    It is important that you exercise regularly, at least 20 minutes 3 to 4 times per week.  If you develop chest pain or shortness of breath seek  medical attention.  You need to lose weight.  Consider a lower calorie diet and regular exercise.  Return in 6 months for follow-up   

## 2014-09-29 NOTE — Progress Notes (Signed)
Pre visit review using our clinic review tool, if applicable. No additional management support is needed unless otherwise documented below in the visit note. 

## 2014-10-12 ENCOUNTER — Encounter: Payer: Medicare Other | Admitting: Internal Medicine

## 2014-10-12 DIAGNOSIS — J0100 Acute maxillary sinusitis, unspecified: Secondary | ICD-10-CM | POA: Diagnosis not present

## 2014-10-12 DIAGNOSIS — R05 Cough: Secondary | ICD-10-CM | POA: Diagnosis not present

## 2014-10-26 ENCOUNTER — Other Ambulatory Visit: Payer: Self-pay | Admitting: Internal Medicine

## 2014-11-24 ENCOUNTER — Other Ambulatory Visit: Payer: Self-pay | Admitting: Family Medicine

## 2015-02-05 ENCOUNTER — Other Ambulatory Visit: Payer: Self-pay | Admitting: Internal Medicine

## 2015-05-06 ENCOUNTER — Other Ambulatory Visit: Payer: Self-pay | Admitting: Internal Medicine

## 2015-05-23 ENCOUNTER — Other Ambulatory Visit: Payer: Self-pay | Admitting: Internal Medicine

## 2015-06-12 ENCOUNTER — Other Ambulatory Visit: Payer: Self-pay | Admitting: Internal Medicine

## 2015-07-05 ENCOUNTER — Encounter: Payer: Self-pay | Admitting: Gastroenterology

## 2015-11-08 NOTE — Progress Notes (Addendum)
Subjective:   Jerry Beasley is a 70 y.o. male who presents for Medicare Annual/Subsequent preventive examination.  Review of Systems:  HRA assessment completed during this visit with Jerry Beasley  The Patient was informed that the wellness visit is to identify future health risk and educate and initiate measures that can reduce risk for increased disease through the lifespan.    The first 20 minutes were spent discussing Jerry Beasley issues with insurance. He dropped his "great insurance plan" due to the expense per month.   Now has regular medicare and [employer] for Part d  Educated regarding the AWV; Allowed him the option of canceling today to fup with Dr Jerry Beasley for labs and med refill and states his only issues is his "feet are burning more" The patient decided to complete the Medicare wellness and did agree to make a fup apt with md in August for med refill and labs.   NO ROS; Medicare Wellness Visit Last OV:  09/2015 ; Labs completed in 09/2014 fup was planned 03/2016 but the patient did not return (A1c was 6.4)  HTN; BP good today Hyperlipidemia / triglycerides 285;  TKR x 2; but doing well; States he cannot get up for a sitting position but other wise does well;    Psychosocial: lives with spouse   Medications reviewed for issues; compliance; otc meds/ understands his recent refill was completed based on a OV to be scheduled   BMI: 37 / educated BMI; metabolic syndrome and pre diabetes   Diet;   States he is In the process of changing his entire eating habits He has cut down on portions  Cut out red meat; Eats mainly Vegetables and carbs;  Nutri system; copying it and doing as they suggest Has lost 3 to 4 lbs;   Discussed his risk for diabetes and A1c in lieu of fasting glucose Discussed exercise and diet combined. Given info below: Controllable  risk for heart disease reviewed for goal setting: Includes; family hx; BP control <140/80; monitoring renal disease;  obesity (BMI >30); sedentary lifestyle Educated regarding Metabolic syndrome / Excess weight around the waist;  Waist circumference for Men >40  Triglycerides > 150 HDL < 50 BP > 130/85 Glucose > 100 He is + 4/5   Heart Healthy Diet; less sugar  Nutritional counseling given: keep working on diet     Exercise;  Has difficulty waking due to knees;   likes to swim but has not been doing this lately;  Will be at the beach a week and will swim every day  HOME SAFETY reviewed for short term and long term;  2 levels; Issues reviewed that may potentiate risk include multilevel or inaccessible homes or long term plan? Bathrooms are accessible; Have completed some remodeling prior to knee surgery Personal safety issues reviewed for risk such as safe community; smoke detector; firearms safety if applicable; protection when in the sun; driving safety for seniors or any recent accidents. Fall hx; no   UA or BOWEL incontinence; no Functional losses from last year to this year? Has difficulty getting up from the floor secondary to knees;   Given education on "Fall Prevention in the Home" for more safety tips the patient can apply as appropriate.   Risk for Depression reviewed: Any emotional problems? Anxious, depressed, irritable, sad or blue? 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  Sleep; ;yes; sleeps well   Cognitive; none to date Manages checkbook, medications; no failures of task Ad8 score reviewed for issues;  Issues making decisions; no  Less interest in hobbies / activities" no  Repeats questions, stories; family complaining: NO  Trouble using ordinary gadgets; microwave; computer:  no  Forgets the month or year: no  Mismanaging finances: no  Missing apt: no but does write them down  Daily problems with thinking of memory NO Ad8 score is 0   Advanced Directive addressed; yes; and and living trust  Counseling Health Maintenance Gaps: Hep c; due and paid by Medicare; will draw at next blood draw Colonoscopy; due: 05/2005; DUE  Postponing due to financial issues  EKG: 09/2013 Prostate cancer screening: deferred    Hearing: 2000hz  both ears   Ophthalmology exam; has glasses/ TBS at some point [facility] in [place];  Glaucoma screening Diabetic retinopathy if appropriate   Immunizations Due: (Vaccines reviewed and educated regarding any overdue)  Had shingles; when he had knee surgery; mild   Established and updated Risk reviewed and appropriate referral made or health recommendations:  Current Care Team reviewed and updated       Objective:    Vitals: BP 136/80   Pulse 78   Ht 5\' 9"  (1.753 m)   Wt 253 lb 8 oz (115 kg)   SpO2 96%   BMI 37.44 kg/m   Body mass index is 37.44 kg/m.  Tobacco History  Smoking Status  . Never Smoker  Smokeless Tobacco  . Never Used     Counseling given: Yes   Past Medical History:  Diagnosis Date  . BENIGN PROSTATIC HYPERTROPHY, MILD, HX OF 10/21/2006  . HYPERLIPIDEMIA 10/21/2006  . HYPERTENSION 10/21/2006  . NEPHROLITHIASIS, HX OF 08/30/2007  . Osteoarth NOS-Unspec 10/21/2006   Past Surgical History:  Procedure Laterality Date  . KNEE SURGERY    . TONSILLECTOMY    . TOTAL KNEE ARTHROPLASTY     Family History  Problem Relation Age of Onset  . Hypertension Neg Hx     family hx   History  Sexual Activity  . Sexual activity: Not on file    Outpatient Encounter Prescriptions as of 11/09/2015  Medication Sig  . amLODipine (NORVASC) 10 MG tablet TAKE ONE TABLET BY MOUTH ONCE DAILY  . aspirin 81 MG tablet Take 81 mg by mouth daily.  . benazepril (LOTENSIN) 40 MG tablet TAKE ONE TABLET BY MOUTH ONCE DAILY  .  Multiple Vitamin (MULTI-VITAMIN DAILY PO) Take 1 tablet by mouth daily.  . simvastatin (ZOCOR) 40 MG tablet TAKE ONE TABLET BY MOUTH ONCE DAILY   No facility-administered encounter medications on file as of 11/09/2015.     Activities of Daily Living No flowsheet data found.  Patient Care Team: [name], MD as PCP - General   Assessment:    ASSESSMENT:   Risk for CV disease reviewed; including BP; Cholesterol; BS if appropriate; diet and exercise;  Diabetes Screening; understands A1c is elevated and has committed to diet  Prostate Screening - deferred  Nutritional Counseling; discussed  Obesity- discussed exercise and to try swimming while on vacation which will help him lose weight  Barriers to Success Coverage but did discuss the necessity of a fup apt.    Exercise Activities and Dietary recommendations    Goals    .  Weight (lb) < 230 lb (104.3 kg)           Controllable  risk for heart disease reviewed for goal setting: Includes; family hx; BP control <140/80; monitoring renal disease; obesity (BMI >30); sedentary lifestyle Educated regarding Metabolic syndrome / Excess weight around the waist;  Waist circumference for Men >40  Triglycerides > 150 HDL < 50 BP > 130/85 Glucose > 100 Plan Lifestyle changes  Heart Healthy Diet; less sugar  Mediterranean Diet: eating primarily plant-based food such as fruits and vegetables, whole grains, legumes and nuts; replacing butter with healthy fats such as olive oil and canola oil Using herbs and spices instead of salt to flavor food Limiting red meat to no more than a few times a month Eating fish and poultry at least 2 times a week Getting plenty of exercise  BP Education: <120/80; any elevation will warrant further checks   Nutritional counseling given: keep working on diet           Fall Risk Fall Risk  09/29/2013  Falls in the past year? No   Depression Screen PHQ 2/9 Scores 09/29/2013  PHQ - 2  Score 0    Cognitive Testing No flowsheet data found.  AD8 score 0  Immunization History  Administered Date(s) Administered  . Influenza Split 01/07/2012  . Influenza Whole 04/14/2004, 03/01/2007  . Influenza,inj,Quad PF,36+ Mos 01/14/2013, 03/01/2014  . Pneumococcal Conjugate-13 09/29/2013  . Pneumococcal Polysaccharide-23 04/14/2004, 04/14/2009, 09/29/2014  . Tdap 09/27/2010   Screening Tests Health Maintenance  Topic Date Due  . Hepatitis C Screening  Apr 17, 1945  . ZOSTAVAX  02/15/2005  . COLONOSCOPY  05/16/2015  . INFLUENZA VACCINE  11/13/2015  . TETANUS/TDAP  09/26/2020  . PNA vac Low Risk Adult  Completed      Plan:     Will schedule apt with Dr. Amador Cunas to fup on meds and blood work Will fup on numbness in feet as well  Will postpone colonoscopy for now but will have when able;   To Schedule eye exam; does not want a referral; will have in [employer] program;  [facility] (Ask for [facility] Help) Available Mon-Thurs, 8:30-4:00, No apt necessary [facility]  [address]  [place] [address]  [phone]  Educated to check with insurance regarding coverage of Shingles vaccination on Part D or Part B and may have lower co-pay if provided on the Part D side   During the course of the visit the patient was educated and counseled about the following appropriate screening and preventive services:   Vaccines to include Pneumoccal, Influenza, Hepatitis B, Td, Zostavax, HCV  Electrocardiogram  Cardiovascular Disease  Colorectal cancer screening  Diabetes screening  Prostate Cancer Screening  Glaucoma screening  Nutrition counseling   Smoking cessation counseling  Patient Instructions (the written plan) was given to the patient.    Montine Circle, RN  11/10/2015  Medicare annual wellness visit Reviewed.  Agree with findings and recommendations.  Rogelia Boga, MD

## 2015-11-09 ENCOUNTER — Ambulatory Visit (INDEPENDENT_AMBULATORY_CARE_PROVIDER_SITE_OTHER): Payer: Medicare Other

## 2015-11-09 VITALS — BP 136/80 | HR 78 | Ht 69.0 in | Wt 253.5 lb

## 2015-11-09 DIAGNOSIS — Z0000 Encounter for general adult medical examination without abnormal findings: Secondary | ICD-10-CM

## 2015-11-09 NOTE — Patient Instructions (Addendum)
Jerry Beasley , Thank you for taking time to come for your Medicare Wellness Visit. I appreciate your ongoing commitment to your health goals. Please review the following plan we discussed and let me know if I can assist you in the future.   Will schedule apt with Dr. Amador Cunas to fup on meds and blood work Will fup on numbness in feet as well  Will postpone colonoscopy for now but will have when able;   To Schedule eye exam; does not want a referral; will have in [employer] program;  [facility] (Ask for [facility]) Available Mon-Thurs, 8:30-4:00, No apt necessary [facility]  [address]  [place] [address]  [phone]  Educated to check with insurance regarding coverage of Shingles vaccination on Part D or Part B and may have lower co-pay if provided on the Part D side     These are the goals we discussed: Goals    . Weight (lb) < 230 lb (104.3 kg)           Controllable  risk for heart disease reviewed for goal setting: Includes; family hx; BP control <140/80; monitoring renal disease; obesity (BMI >30); sedentary lifestyle Educated regarding Metabolic syndrome / Excess weight around the waist;  Waist circumference for Men >40  Triglycerides > 150 HDL < 50 BP > 130/85 Glucose > 100 Plan Lifestyle changes  Heart Healthy Diet; less sugar  Mediterranean Diet: eating primarily plant-based food such as fruits and vegetables, whole grains, legumes and nuts; replacing butter with healthy fats such as olive oil and canola oil Using herbs and spices instead of salt to flavor food Limiting red meat to no more than a few times a month Eating fish and poultry at least 2 times a week Getting plenty of exercise  BP Education: <120/80; any elevation will warrant further checks   Nutritional counseling given: keep working on diet            This is a list of the screening recommended for you and  due dates:  Health Maintenance  Topic Date Due  .  Hepatitis C: One time screening is recommended by Center for Disease Control  (CDC) for  adults born from [birth year] through 1966.   [birth year]  . Shingles Vaccine  02/15/2005  . Colon Cancer Screening  05/16/2015  . Flu Shot  11/13/2015  . Tetanus Vaccine  09/26/2020  . Pneumonia vaccines  Completed      Screening for Type 2 Diabetes Screening is a way to check for type 2 diabetes in people who do not have symptoms of the disease, but who may likely develop diabetes in the future. Diabetes can lead to serious health problems, but finding diabetes early allows for early treatment. DIABETES RISK FACTORS   Family history of diabetes.  Diseases of the pancreas.  Obesity or being overweight.  Certain racial or ethnic groups:  American [place].  Pacific Islander.  Hispanic.  Asian.  African American.  High blood pressure (hypertension).  History of diabetes while pregnant (gestational diabetes).  Delivering a baby that weighed over 9 pounds.  Being inactive.  High cholesterol or triglycerides.  Age, especially over 30 years of age.  Other diseases or conditions.  Diseases of the pancreas.  Cardiovascular disease.  Disorders of the endocrine system.  Certain medicines, such as those that treat high blood cholesterol levels. WHO IS SCREENED Adults  Adults who have no risk factors and no symptoms should  be screened starting at age 57. If the screening tests are normal, they should be repeated every 3 years.  Adults who do not have symptoms, but have 1 or more risk factors, should be screened.  Adults who have 2 or more risk factors may be screened every year.  Adults who have an A1c (3 month average of blood glucose) greater than 5.7% or who had an impaired glucose tolerance (IGT) or impaired fasting glucose (IFG) on a previous test should be screened.  Pregnant women who have risk factors should be screened at  their first prenatal visit.  Women who have given birth and had gestational diabetes should be screened 6-12 weeks after the child is born. This screening should be repeated every 1-3 years after the first test. Children or Adolescents  Children and adolescents should be screened for type 2 diabetes if they are overweight and have 2 of the following risk factors:  Having a family history of type 2 diabetes.  Being a member of a high risk race or ethnic group.  Having signs of insulin resistance or conditions associated with insulin resistance.  Having a mother who had gestational diabetes while pregnant with him or her.  Screening should start at age 42 or at the onset of puberty, whichever comes first. This should be repeated every 2 years. SCREENING In a screening, your caregiver may:  Ask questions about your overall health. This will include questions about the health of close family members, too.  Ask about any diabetes-like symptoms you may have.  Perform a physical exam.  Order some tests that may include:  A fasting plasma glucose test. This measures the level of glucose in your blood. It is done after you have had nothing to eat but water (fasted) for 8 hours.  A random blood glucose test. This test is done without the need to fast.  An oral glucose tolerance test. This is a blood test done in 2 parts. First, a blood sample is taken after you have fasted. Then, another sample is taken after you drink a liquid that contains a lot of sugar.  An A1c test. This test shows how much glucose has been in your blood over the past 2 to 3 months.   This information is not intended to replace advice given to you by your health care provider. Make sure you discuss any questions you have with your health care provider.   Document Released: 01/25/2009 Document Revised: 04/21/2014 Document Reviewed: 11/06/2010 Elsevier Interactive Patient Education 2016 [employer].   Fall  Prevention in the Home  Falls can cause injuries. They can happen to people of all ages. There are many things you can do to make your home safe and to help prevent falls.  WHAT CAN I DO ON THE OUTSIDE OF MY HOME?  Regularly fix the edges of walkways and driveways and fix any cracks.  Remove anything that might make you trip as you walk through a door, such as a raised step or threshold.  Trim any bushes or trees on the path to your home.  Use bright outdoor lighting.  Clear any walking paths of anything that might make someone trip, such as rocks or tools.  Regularly check to see if handrails are loose or broken. Make sure that both sides of any steps have handrails.  Any raised decks and porches should have guardrails on the edges.  Have any leaves, snow, or ice cleared regularly.  Use sand or salt on walking  paths during winter.  Clean up any spills in your garage right away. This includes oil or grease spills. WHAT CAN I DO IN THE BATHROOM?   Use night lights.  Install grab bars by the toilet and in the tub and shower. Do not use towel bars as grab bars.  Use non-skid mats or decals in the tub or shower.  If you need to sit down in the shower, use a plastic, non-slip stool.  Keep the floor dry. Clean up any water that spills on the floor as soon as it happens.  Remove soap buildup in the tub or shower regularly.  Attach bath mats securely with double-sided non-slip rug tape.  Do not have throw rugs and other things on the floor that can make you trip. WHAT CAN I DO IN THE BEDROOM?  Use night lights.  Make sure that you have a light by your bed that is easy to reach.  Do not use any sheets or blankets that are too big for your bed. They should not hang down onto the floor.  Have a firm chair that has side arms. You can use this for support while you get dressed.  Do not have throw rugs and other things on the floor that can make you trip. WHAT CAN I DO IN THE  KITCHEN?  Clean up any spills right away.  Avoid walking on wet floors.  Keep items that you use a lot in easy-to-reach places.  If you need to reach something above you, use a strong step stool that has a grab bar.  Keep electrical cords out of the way.  Do not use floor polish or wax that makes floors slippery. If you must use wax, use non-skid floor wax.  Do not have throw rugs and other things on the floor that can make you trip. WHAT CAN I DO WITH MY STAIRS?  Do not leave any items on the stairs.  Make sure that there are handrails on both sides of the stairs and use them. Fix handrails that are broken or loose. Make sure that handrails are as long as the stairways.  Check any carpeting to make sure that it is firmly attached to the stairs. Fix any carpet that is loose or worn.  Avoid having throw rugs at the top or bottom of the stairs. If you do have throw rugs, attach them to the floor with carpet tape.  Make sure that you have a light switch at the top of the stairs and the bottom of the stairs. If you do not have them, ask someone to add them for you. WHAT ELSE CAN I DO TO HELP PREVENT FALLS?  Wear shoes that:  Do not have high heels.  Have rubber bottoms.  Are comfortable and fit you well.  Are closed at the toe. Do not wear sandals.  If you use a stepladder:  Make sure that it is fully opened. Do not climb a closed stepladder.  Make sure that both sides of the stepladder are locked into place.  Ask someone to hold it for you, if possible.  Clearly mark and make sure that you can see:  Any grab bars or handrails.  First and last steps.  Where the edge of each step is.  Use tools that help you move around (mobility aids) if they are needed. These include:  Canes.  Walkers.  Scooters.  Crutches.  Turn on the lights when you go into a dark area. Replace any  light bulbs as soon as they burn out.  Set up your furniture so you have a clear path.  Avoid moving your furniture around.  If any of your floors are uneven, fix them.  If there are any pets around you, be aware of where they are.  Review your medicines with your doctor. Some medicines can make you feel dizzy. This can increase your chance of falling. Ask your doctor what other things that you can do to help prevent falls.   This information is not intended to replace advice given to you by your health care provider. Make sure you discuss any questions you have with your health care provider.   Document Released: 01/25/2009 Document Revised: 08/15/2014 Document Reviewed: 05/05/2014 Elsevier Interactive Patient Education 2016 [employer].  Health Maintenance, Male A healthy lifestyle and preventative care can promote health and wellness.  Maintain regular health, dental, and eye exams.  Eat a healthy diet. Foods like vegetables, fruits, whole grains, low-fat dairy products, and lean protein foods contain the nutrients you need and are low in calories. Decrease your intake of foods high in solid fats, added sugars, and salt. Get information about a proper diet from your health care provider, if necessary.  Regular physical exercise is one of the most important things you can do for your health. Most adults should get at least 150 minutes of moderate-intensity exercise (any activity that increases your heart rate and causes you to sweat) each week. In addition, most adults need muscle-strengthening exercises on 2 or more days a week.   Maintain a healthy weight. The body mass index (BMI) is a screening tool to identify possible weight problems. It provides an estimate of body fat based on height and weight. Your health care provider can find your BMI and can help you achieve or maintain a healthy weight. For males 20 years and older:  A BMI below 18.5 is considered underweight.  A BMI of 18.5 to 24.9 is normal.  A BMI of 25 to 29.9 is considered overweight.  A BMI of 30  and above is considered obese.  Maintain normal blood lipids and cholesterol by exercising and minimizing your intake of saturated fat. Eat a balanced diet with plenty of fruits and vegetables. Blood tests for lipids and cholesterol should begin at age 36 and be repeated every 5 years. If your lipid or cholesterol levels are high, you are over age 66, or you are at high risk for heart disease, you may need your cholesterol levels checked more frequently.Ongoing high lipid and cholesterol levels should be treated with medicines if diet and exercise are not working.  If you smoke, find out from your health care provider how to quit. If you do not use tobacco, do not start.  Lung cancer screening is recommended for adults aged 55-80 years who are at high risk for developing lung cancer because of a history of smoking. A yearly low-dose CT scan of the lungs is recommended for people who have at least a 30-pack-year history of smoking and are current smokers or have quit within the past 15 years. A pack year of smoking is smoking an average of 1 pack of cigarettes a day for 1 year (for example, a 30-pack-year history of smoking could mean smoking 1 pack a day for 30 years or 2 packs a day for 15 years). Yearly screening should continue until the smoker has stopped smoking for at least 15 years. Yearly screening should be stopped for people who  develop a health problem that would prevent them from having lung cancer treatment.  If you choose to drink alcohol, do not have more than 2 drinks per day. One drink is considered to be 12 oz (360 mL) of beer, 5 oz (150 mL) of wine, or 1.5 oz (45 mL) of liquor.  Avoid the use of street drugs. Do not share needles with anyone. Ask for help if you need support or instructions about stopping the use of drugs.  High blood pressure causes heart disease and increases the risk of stroke. High blood pressure is more likely to develop in:  People who have blood pressure in  the end of the normal range (100-139/85-89 mm Hg).  People who are overweight or obese.  People who are African American.  If you are 61-12 years of age, have your blood pressure checked every 3-5 years. If you are 38 years of age or older, have your blood pressure checked every year. You should have your blood pressure measured twice--once when you are at a hospital or clinic, and once when you are not at a hospital or clinic. Record the average of the two measurements. To check your blood pressure when you are not at a hospital or clinic, you can use:  An automated blood pressure machine at a pharmacy.  A home blood pressure monitor.  If you are 30-28 years old, ask your health care provider if you should take aspirin to prevent heart disease.  Diabetes screening involves taking a blood sample to check your fasting blood sugar level. This should be done once every 3 years after age 55 if you are at a normal weight and without risk factors for diabetes. Testing should be considered at a younger age or be carried out more frequently if you are overweight and have at least 1 risk factor for diabetes.  Colorectal cancer can be detected and often prevented. Most routine colorectal cancer screening begins at the age of 83 and continues through age 14. However, your health care provider may recommend screening at an earlier age if you have risk factors for colon cancer. On a yearly basis, your health care provider may provide home test kits to check for hidden blood in the stool. A small camera at the end of a tube may be used to directly examine the colon (sigmoidoscopy or colonoscopy) to detect the earliest forms of colorectal cancer. Talk to your health care provider about this at age 13 when routine screening begins. A direct exam of the colon should be repeated every 5-10 years through age 64, unless early forms of precancerous polyps or small growths are found.  People who are at an increased risk  for hepatitis B should be screened for this virus. You are considered at high risk for hepatitis B if:  You were born in a country where hepatitis B occurs often. Talk with your health care provider about which countries are considered high risk.  Your parents were born in a high-risk country and you have not received a shot to protect against hepatitis B (hepatitis B vaccine).  You have HIV or AIDS.  You use needles to inject street drugs.  You live with, or have sex with, someone who has hepatitis B.  You are a man who has sex with other men (MSM).  You get hemodialysis treatment.  You take certain medicines for conditions like cancer, organ transplantation, and autoimmune conditions.  Hepatitis C blood testing is recommended for all people born  from 1945 through 1965 and any individual with known risk factors for hepatitis C.  Healthy men should no longer receive prostate-specific antigen (PSA) blood tests as part of routine cancer screening. Talk to your health care provider about prostate cancer screening.  Testicular cancer screening is not recommended for adolescents or adult males who have no symptoms. Screening includes self-exam, a health care provider exam, and other screening tests. Consult with your health care provider about any symptoms you have or any concerns you have about testicular cancer.  Practice safe sex. Use condoms and avoid high-risk sexual practices to reduce the spread of sexually transmitted infections (STIs).  You should be screened for STIs, including gonorrhea and chlamydia if:  You are sexually active and are younger than 24 years.  You are older than 24 years, and your health care provider tells you that you are at risk for this type of infection.  Your sexual activity has changed since you were last screened, and you are at an increased risk for chlamydia or gonorrhea. Ask your health care provider if you are at risk.  If you are at risk of being  infected with HIV, it is recommended that you take a prescription medicine daily to prevent HIV infection. This is called pre-exposure prophylaxis (PrEP). You are considered at risk if:  You are a man who has sex with other men (MSM).  You are a heterosexual man who is sexually active with multiple partners.  You take drugs by injection.  You are sexually active with a partner who has HIV.  Talk with your health care provider about whether you are at high risk of being infected with HIV. If you choose to begin PrEP, you should first be tested for HIV. You should then be tested every 3 months for as long as you are taking PrEP.  Use sunscreen. Apply sunscreen liberally and repeatedly throughout the day. You should seek shade when your shadow is shorter than you. Protect yourself by wearing long sleeves, pants, a wide-brimmed hat, and sunglasses year round whenever you are outdoors.  Tell your health care provider of new moles or changes in moles, especially if there is a change in shape or color. Also, tell your health care provider if a mole is larger than the size of a pencil eraser.  A one-time screening for abdominal aortic aneurysm (AAA) and surgical repair of large AAAs by ultrasound is recommended for men aged 65-75 years who are current or former smokers.  Stay current with your vaccines (immunizations).   This information is not intended to replace advice given to you by your health care provider. Make sure you discuss any questions you have with your health care provider.   Document Released: 09/27/2007 Document Revised: 04/21/2014 Document Reviewed: 08/26/2010 Elsevier Interactive Patient Education 2016 [employer].   Hearing Loss Hearing loss is a partial or total loss of the ability to hear. This can be temporary or permanent, and it can happen in one or both ears. Hearing loss may be referred to as deafness. Medical care is necessary to treat hearing loss properly and to  prevent the condition from getting worse. Your hearing may partially or completely come back, depending on what caused your hearing loss and how severe it is. In some cases, hearing loss is permanent. CAUSES Common causes of hearing loss include:   Too much wax in the ear canal.   Infection of the ear canal or middle ear.   Fluid in the middle  ear.   Injury to the ear or surrounding area.   An object stuck in the ear.   Prolonged exposure to loud sounds, such as music.  Less common causes of hearing loss include:   Tumors in the ear.   Viral or bacterial infections, such as meningitis.   A hole in the eardrum (perforated eardrum).  Problems with the hearing nerve that sends signals between the brain and the ear.  Certain medicines.  SYMPTOMS  Symptoms of this condition may include:  Difficulty telling the difference between sounds.  Difficulty following a conversation when there is background noise.  Lack of response to sounds in your environment. This may be most noticeable when you do not respond to startling sounds.  Needing to turn up the volume on the television, radio, etc.  Ringing in the ears.  Dizziness.  Pain in the ears. DIAGNOSIS This condition is diagnosed based on a physical exam and a hearing test (audiometry). The audiometry test will be performed by a hearing specialist (audiologist). You may also be referred to an ear, nose, and throat (ENT) specialist (otolaryngologist).  TREATMENT Treatment for recent onset of hearing loss may include:   Ear wax removal.   Being prescribed medicines to prevent infection (antibiotics).   Being prescribed medicines to reduce inflammation (corticosteroids).  HOME CARE INSTRUCTIONS  If you were prescribed an antibiotic medicine, take it as told by your health care provider. Do not stop taking the antibiotic even if you start to feel better.  Take over-the-counter and prescription medicines only as  told by your health care provider.  Avoid loud noises.   Return to your normal activities as told by your health care provider. Ask your health care provider what activities are safe for you.  Keep all follow-up visits as told by your health care provider. This is important. SEEK MEDICAL CARE IF:   You feel dizzy.   You develop new symptoms.   You vomit or feel nauseous.   You have a fever.  SEEK IMMEDIATE MEDICAL CARE IF:  You develop sudden changes in your vision.   You have severe ear pain.   You have new or increased weakness.  You have a severe headache.   This information is not intended to replace advice given to you by your health care provider. Make sure you discuss any questions you have with your health care provider.   Document Released: 03/31/2005 Document Revised: 12/20/2014 Document Reviewed: 08/16/2014 Elsevier Interactive Patient Education [employer]

## 2015-11-21 ENCOUNTER — Other Ambulatory Visit: Payer: Self-pay | Admitting: Internal Medicine

## 2015-11-21 NOTE — Telephone Encounter (Signed)
Rx refill sent to pharmacy. 

## 2016-03-22 ENCOUNTER — Other Ambulatory Visit: Payer: Self-pay | Admitting: Internal Medicine

## 2016-04-02 ENCOUNTER — Other Ambulatory Visit: Payer: Self-pay | Admitting: Internal Medicine

## 2016-06-11 ENCOUNTER — Other Ambulatory Visit: Payer: Self-pay | Admitting: Internal Medicine

## 2016-07-30 ENCOUNTER — Ambulatory Visit (INDEPENDENT_AMBULATORY_CARE_PROVIDER_SITE_OTHER): Payer: Medicare Other | Admitting: Internal Medicine

## 2016-07-30 ENCOUNTER — Encounter: Payer: Self-pay | Admitting: Internal Medicine

## 2016-07-30 VITALS — BP 148/82 | HR 100 | Temp 97.9°F | Ht 69.0 in | Wt 249.8 lb

## 2016-07-30 DIAGNOSIS — E785 Hyperlipidemia, unspecified: Secondary | ICD-10-CM

## 2016-07-30 DIAGNOSIS — G629 Polyneuropathy, unspecified: Secondary | ICD-10-CM

## 2016-07-30 DIAGNOSIS — E538 Deficiency of other specified B group vitamins: Secondary | ICD-10-CM

## 2016-07-30 DIAGNOSIS — I10 Essential (primary) hypertension: Secondary | ICD-10-CM | POA: Diagnosis not present

## 2016-07-30 DIAGNOSIS — R7302 Impaired glucose tolerance (oral): Secondary | ICD-10-CM | POA: Diagnosis not present

## 2016-07-30 LAB — COMPREHENSIVE METABOLIC PANEL
ALBUMIN: 4.5 g/dL (ref 3.5–5.2)
ALK PHOS: 93 U/L (ref 39–117)
ALT: 32 U/L (ref 0–53)
AST: 23 U/L (ref 0–37)
BILIRUBIN TOTAL: 0.7 mg/dL (ref 0.2–1.2)
BUN: 22 mg/dL (ref 6–23)
CALCIUM: 9.7 mg/dL (ref 8.4–10.5)
CO2: 26 mEq/L (ref 19–32)
Chloride: 104 mEq/L (ref 96–112)
Creatinine, Ser: 1.15 mg/dL (ref 0.40–1.50)
GFR: 66.54 mL/min (ref >60.00)
GLUCOSE: 227 mg/dL — AB (ref 70–99)
POTASSIUM: 4.3 meq/L (ref 3.5–5.1)
Sodium: 139 mEq/L (ref 135–145)
TOTAL PROTEIN: 7.1 g/dL (ref 6.0–8.3)

## 2016-07-30 LAB — CBC WITH DIFFERENTIAL/PLATELET
BASOS ABS: 0.1 10*3/uL (ref 0.0–0.1)
Basophils Relative: 1 % (ref 0.0–3.0)
EOS ABS: 0.1 10*3/uL (ref 0.0–0.7)
Eosinophils Relative: 1.4 % (ref 0.0–5.0)
HEMATOCRIT: 44.7 % (ref 39.0–52.0)
Hemoglobin: 15.2 g/dL (ref 13.0–17.0)
LYMPHS PCT: 21.9 % (ref 12.0–46.0)
Lymphs Abs: 2.1 10*3/uL (ref 0.7–4.0)
MCHC: 33.9 g/dL (ref 30.0–36.0)
MCV: 88.8 fl (ref 78.0–100.0)
MONOS PCT: 10.2 % (ref 3.0–12.0)
Monocytes Absolute: 1 10*3/uL (ref 0.1–1.0)
Neutro Abs: 6.2 10*3/uL (ref 1.4–7.7)
Neutrophils Relative %: 65.5 % (ref 43.0–77.0)
Platelets: 275 10*3/uL (ref 150.0–400.0)
RBC: 5.04 Mil/uL (ref 4.22–5.81)
RDW: 13.6 % (ref 11.5–15.5)
WBC: 9.4 10*3/uL (ref 4.0–10.5)

## 2016-07-30 LAB — TSH: TSH: 1.44 u[IU]/mL (ref 0.35–4.50)

## 2016-07-30 LAB — LIPID PANEL
CHOLESTEROL: 230 mg/dL — AB (ref 0–200)
HDL: 49.7 mg/dL (ref >39.00)
NONHDL: 180.69
TRIGLYCERIDES: 348 mg/dL — AB (ref 0.0–149.0)
Total CHOL/HDL Ratio: 5
VLDL: 69.6 mg/dL — ABNORMAL HIGH (ref 0.0–40.0)

## 2016-07-30 LAB — HEMOGLOBIN A1C: HEMOGLOBIN A1C: 9.2 % — AB (ref 4.6–6.5)

## 2016-07-30 LAB — LDL CHOLESTEROL, DIRECT: Direct LDL: 121 mg/dL

## 2016-07-30 LAB — VITAMIN B12: VITAMIN B 12: 508 pg/mL (ref 211–911)

## 2016-07-30 MED ORDER — DULOXETINE HCL 30 MG PO CPEP: 30.0000 mg | ORAL_CAPSULE | Freq: Every day | ORAL | 2 refills | Status: DC

## 2016-07-30 MED ORDER — AMLODIPINE BESYLATE 10 MG PO TABS: 10.0000 mg | ORAL_TABLET | Freq: Every day | ORAL | 0 refills | Status: DC

## 2016-07-30 MED ORDER — TRAMADOL HCL 50 MG PO TABS: 50.0000 mg | ORAL_TABLET | Freq: Four times a day (QID) | ORAL | 0 refills | Status: DC | PRN

## 2016-07-30 MED ORDER — SIMVASTATIN 40 MG PO TABS: 40.0000 mg | ORAL_TABLET | Freq: Every day | ORAL | 1 refills | Status: DC

## 2016-07-30 MED ORDER — BENAZEPRIL HCL 40 MG PO TABS: 40.0000 mg | ORAL_TABLET | Freq: Every day | ORAL | 1 refills | Status: DC

## 2016-07-30 NOTE — Patient Instructions (Addendum)
Diabetic Neuropathy Diabetic neuropathy is a nerve disease or nerve damage that is caused by diabetes mellitus. About half of all people with diabetes mellitus have some form of nerve damage. Nerve damage is more common in those who have had diabetes mellitus for many years and who generally have not had good control of their blood sugar (glucose) level. Diabetic neuropathy is a common complication of diabetes mellitus. There are three common types of diabetic neuropathy and a fourth type that is less common and less understood:  Peripheral neuropathy-This is the most common type of diabetic neuropathy. It causes damage to the nerves of the feet and legs first and then eventually the hands and arms. The damage affects the ability to sense touch.  Autonomic neuropathy-This type causes damage to the autonomic nervous system, which controls the following functions:  Heartbeat.  Body temperature.  Blood pressure.  Urination.  Digestion.  Sweating.  Sexual function.  Focal neuropathy-Focal neuropathy can be painful and unpredictable and occurs most often in older adults with diabetes mellitus. It involves a specific nerve or one area and often comes on suddenly. It usually does not cause long-term problems.  Radiculoplexus neuropathy- Sometimes called lumbosacral radiculoplexus neuropathy, radiculoplexus neuropathy affects the nerves of the thighs, hips, buttocks, or legs. It is more common in people with type 2 diabetes mellitus and in older men. It is characterized by debilitating pain, weakness, and atrophy, usually in the thigh muscles. What are the causes? The cause of peripheral, autonomic, and focal neuropathies is diabetes mellitus that is uncontrolled and high glucose levels. The cause of radiculoplexus neuropathy is unknown. However, it is thought to be caused by inflammation related to uncontrolled glucose levels. What are the signs or symptoms? Peripheral Neuropathy  Peripheral  neuropathy develops slowly over time. When the nerves of the feet and legs no longer work there may be:  Burning, stabbing, or aching pain in the legs or feet.  Inability to feel pressure or pain in your feet. This can lead to:  Thick calluses over pressure areas.  Pressure sores.  Ulcers.  Foot deformities.  Reduced ability to feel temperature changes.  Muscle weakness. Autonomic Neuropathy  The symptoms of autonomic neuropathy vary depending on which nerves are affected. Symptoms may include:  Problems with digestion, such as:  Feeling sick to your stomach (nausea).  Vomiting.  Bloating.  Constipation.  Diarrhea.  Abdominal pain.  Difficulty with urination. This occurs if you lose your ability to sense when your bladder is full. Problems include:  Urine leakage (incontinence).  Inability to empty your bladder completely (retention).  Rapid or irregular heartbeat (palpitations).  Blood pressure drops when you stand up (orthostatic hypotension). When you stand up you may feel:  Dizzy.  Weak.  Faint.  In men, inability to attain and maintain an erection.  In women, vaginal dryness and problems with decreased sexual desire and arousal.  Problems with body temperature regulation.  Increased or decreased sweating. Focal Neuropathy   Abnormal eye movements or abnormal alignment of both eyes.  Weakness in the wrist.  Foot drop. This results in an inability to lift the foot properly and abnormal walking or foot movement.  Paralysis on one side of your face (Bell palsy).  Chest or abdominal pain. Radiculoplexus Neuropathy   Sudden, severe pain in your hip, thigh, or buttocks.  Weakness and wasting of thigh muscles.  Difficulty rising from a seated position.  Abdominal swelling.  Unexplained weight loss (usually more than 10 lb [4.5 kg]). How  is this diagnosed? Peripheral Neuropathy  Your senses may be tested. Sensory function testing can be  done with:  A light touch using a monofilament.  A vibration with tuning fork.  A sharp sensation with a pin prick. Other tests that can help diagnose neuropathy are:  Nerve conduction velocity. This test checks the transmission of an electrical current through a nerve.  Electromyography. This shows how muscles respond to electrical signals transmitted by nearby nerves.  Quantitative sensory testing. This is used to assess how your nerves respond to vibrations and changes in temperature. Autonomic Neuropathy  Diagnosis is often based on reported symptoms. Tell your health care provider if you experience:  Dizziness.  Constipation.  Diarrhea.  Inappropriate urination or inability to urinate.  Inability to get or maintain an erection. Tests that may be done include:  Electrocardiography or Holter monitor. These are tests that can help show problems with the heart rate or heart rhythm.  An X-ray exam may be done. Focal Neuropathy  Diagnosis is made based on your symptoms and what your health care provider finds during your exam. Other tests may be done. They may include:  Nerve conduction velocities. This checks the transmission of electrical current through a nerve.  Electromyography. This shows how muscles respond to electrical signals transmitted by nearby nerves.  Quantitative sensory testing. This test is used to assess how your nerves respond to vibration and changes in temperature. Radiculoplexus Neuropathy   Often the first thing is to eliminate any other issue or problems that might be the cause, as there is no standard test for diagnosis.  X-ray exam of your spine and lumbar region.  Spinal tap to rule out cancer.  MRI to rule out other lesions. How is this treated? Once nerve damage occurs, it cannot be reversed. The goal of treatment is to keep the disease or nerve damage from getting worse and affecting more nerve fibers. Controlling your blood glucose level  is the key. Most people with radiculoplexus neuropathy see at least a partial improvement over time. You will need to keep your blood glucose and HbA1c levels in the target range determined by your health care provider. Things that help control blood glucose levels include:  Blood glucose monitoring.  Meal planning.  Physical activity.  Diabetes medicine. Over time, maintaining lower blood glucose levels helps lessen symptoms. Sometimes, prescription pain medicine is needed. Follow these instructions at home:  Do not smoke.  Keep your blood glucose level in the range that you and your health care provider have determined acceptable for you.  Keep your blood pressure level in the range that you and your health care provider have determined acceptable for you.  Eat a well-balanced diet.  Be physically active every day. Include strength training and balance exercises.  Protect your feet.  Check your feet every day for sores, cuts, blisters, or signs of infection.  Wear padded socks and supportive shoes. Use orthotic inserts, if necessary.  Regularly check the insides of your shoes for worn spots. Make sure there are no rocks or other items inside your shoes before you put them on. Contact a health care provider if:  You have burning, stabbing, or aching pain in the legs or feet.  You are unable to feel pressure or pain in your feet.  You develop problems with digestion such as:  Nausea.  Vomiting.  Bloating.  Constipation.  Diarrhea.  Abdominal pain.  You have difficulty with urination, such as:  Incontinence.  Retention.  You have palpitations.  You develop orthostatic hypotension. When you stand up you may feel:  Dizzy.  Weak.  Faint.  You cannot attain and maintain an erection (in men).  You have vaginal dryness and problems with decreased sexual desire and arousal (in women).  You have severe pain in your thighs, legs, or buttocks.  You have  unexplained weight loss. This information is not intended to replace advice given to you by your health care provider. Make sure you discuss any questions you have with your health care provider. Document Released: 06/09/2001 Document Revised: 09/06/2015 Document Reviewed: 09/09/2012 [employer] Interactive Patient Education  2017 [employer]  Diabetic Neuropathy Diabetic neuropathy is a nerve disease or nerve damage that is caused by diabetes mellitus. About half of all people with diabetes mellitus have some form of nerve damage. Nerve damage is more common in those who have had diabetes mellitus for many years and who generally have not had good control of their blood sugar (glucose) level. Diabetic neuropathy is a common complication of diabetes mellitus. There are three common types of diabetic neuropathy and a fourth type that is less common and less understood:  Peripheral neuropathy-This is the most common type of diabetic neuropathy. It causes damage to the nerves of the feet and legs first and then eventually the hands and arms. The damage affects the ability to sense touch.  Autonomic neuropathy-This type causes damage to the autonomic nervous system, which controls the following functions:  Heartbeat.  Body temperature.  Blood pressure.  Urination.  Digestion.  Sweating.  Sexual function.  Focal neuropathy-Focal neuropathy can be painful and unpredictable and occurs most often in older adults with diabetes mellitus. It involves a specific nerve or one area and often comes on suddenly. It usually does not cause long-term problems.  Radiculoplexus neuropathy- Sometimes called lumbosacral radiculoplexus neuropathy, radiculoplexus neuropathy affects the nerves of the thighs, hips, buttocks, or legs. It is more common in people with type 2 diabetes mellitus and in older men. It is characterized by debilitating pain, weakness, and atrophy, usually in the thigh muscles. What are the  causes? The cause of peripheral, autonomic, and focal neuropathies is diabetes mellitus that is uncontrolled and high glucose levels. The cause of radiculoplexus neuropathy is unknown. However, it is thought to be caused by inflammation related to uncontrolled glucose levels. What are the signs or symptoms? Peripheral Neuropathy  Peripheral neuropathy develops slowly over time. When the nerves of the feet and legs no longer work there may be:  Burning, stabbing, or aching pain in the legs or feet.  Inability to feel pressure or pain in your feet. This can lead to:  Thick calluses over pressure areas.  Pressure sores.  Ulcers.  Foot deformities.  Reduced ability to feel temperature changes.  Muscle weakness. Autonomic Neuropathy  The symptoms of autonomic neuropathy vary depending on which nerves are affected. Symptoms may include:  Problems with digestion, such as:  Feeling sick to your stomach (nausea).  Vomiting.  Bloating.  Constipation.  Diarrhea.  Abdominal pain.  Difficulty with urination. This occurs if you lose your ability to sense when your bladder is full. Problems include:  Urine leakage (incontinence).  Inability to empty your bladder completely (retention).  Rapid or irregular heartbeat (palpitations).  Blood pressure drops when you stand up (orthostatic hypotension). When you stand up you may feel:  Dizzy.  Weak.  Faint.  In men, inability to attain and maintain an erection.  In women, vaginal dryness and problems  with decreased sexual desire and arousal.  Problems with body temperature regulation.  Increased or decreased sweating. Focal Neuropathy   Abnormal eye movements or abnormal alignment of both eyes.  Weakness in the wrist.  Foot drop. This results in an inability to lift the foot properly and abnormal walking or foot movement.  Paralysis on one side of your face (Bell palsy).  Chest or abdominal pain. Radiculoplexus  Neuropathy   Sudden, severe pain in your hip, thigh, or buttocks.  Weakness and wasting of thigh muscles.  Difficulty rising from a seated position.  Abdominal swelling.  Unexplained weight loss (usually more than 10 lb [4.5 kg]). How is this diagnosed? Peripheral Neuropathy  Your senses may be tested. Sensory function testing can be done with:  A light touch using a monofilament.  A vibration with tuning fork.  A sharp sensation with a pin prick. Other tests that can help diagnose neuropathy are:  Nerve conduction velocity. This test checks the transmission of an electrical current through a nerve.  Electromyography. This shows how muscles respond to electrical signals transmitted by nearby nerves.  Quantitative sensory testing. This is used to assess how your nerves respond to vibrations and changes in temperature. Autonomic Neuropathy  Diagnosis is often based on reported symptoms. Tell your health care provider if you experience:  Dizziness.  Constipation.  Diarrhea.  Inappropriate urination or inability to urinate.  Inability to get or maintain an erection. Tests that may be done include:  Electrocardiography or Holter monitor. These are tests that can help show problems with the heart rate or heart rhythm.  An X-ray exam may be done. Focal Neuropathy  Diagnosis is made based on your symptoms and what your health care provider finds during your exam. Other tests may be done. They may include:  Nerve conduction velocities. This checks the transmission of electrical current through a nerve.  Electromyography. This shows how muscles respond to electrical signals transmitted by nearby nerves.  Quantitative sensory testing. This test is used to assess how your nerves respond to vibration and changes in temperature. Radiculoplexus Neuropathy   Often the first thing is to eliminate any other issue or problems that might be the cause, as there is no standard test  for diagnosis.  X-ray exam of your spine and lumbar region.  Spinal tap to rule out cancer.  MRI to rule out other lesions. How is this treated? Once nerve damage occurs, it cannot be reversed. The goal of treatment is to keep the disease or nerve damage from getting worse and affecting more nerve fibers. Controlling your blood glucose level is the key. Most people with radiculoplexus neuropathy see at least a partial improvement over time. You will need to keep your blood glucose and HbA1c levels in the target range determined by your health care provider. Things that help control blood glucose levels include:  Blood glucose monitoring.  Meal planning.  Physical activity.  Diabetes medicine. Over time, maintaining lower blood glucose levels helps lessen symptoms. Sometimes, prescription pain medicine is needed. Follow these instructions at home:  Do not smoke.  Keep your blood glucose level in the range that you and your health care provider have determined acceptable for you.  Keep your blood pressure level in the range that you and your health care provider have determined acceptable for you.  Eat a well-balanced diet.  Be physically active every day. Include strength training and balance exercises.  Protect your feet.  Check your feet every day for sores,  cuts, blisters, or signs of infection.  Wear padded socks and supportive shoes. Use orthotic inserts, if necessary.  Regularly check the insides of your shoes for worn spots. Make sure there are no rocks or other items inside your shoes before you put them on. Contact a health care provider if:  You have burning, stabbing, or aching pain in the legs or feet.  You are unable to feel pressure or pain in your feet.  You develop problems with digestion such as:  Nausea.  Vomiting.  Bloating.  Constipation.  Diarrhea.  Abdominal pain.  You have difficulty with urination, such  as:  Incontinence.  Retention.  You have palpitations.  You develop orthostatic hypotension. When you stand up you may feel:  Dizzy.  Weak.  Faint.  You cannot attain and maintain an erection (in men).  You have vaginal dryness and problems with decreased sexual desire and arousal (in women).  You have severe pain in your thighs, legs, or buttocks.  You have unexplained weight loss. This information is not intended to replace advice given to you by your health care provider. Make sure you discuss any questions you have with your health care provider. Document Released: 06/09/2001 Document Revised: 09/06/2015 Document Reviewed: 09/09/2012 [employer] Interactive Patient Education  2017 [employer]  Peripheral Neuropathy Peripheral neuropathy is a type of nerve damage. It affects nerves that carry signals between the spinal cord and other parts of the body. These are called peripheral nerves. With peripheral neuropathy, one nerve or a group of nerves may be damaged. What are the causes? Many things can damage peripheral nerves. For some people with peripheral neuropathy, the cause is unknown. Some causes include:  Diabetes. This is the most common cause of peripheral neuropathy.  Injury to a nerve.  Pressure or stress on a nerve that lasts a long time.  Too little vitamin B. Alcoholism can lead to this.  Infections.  Autoimmune diseases, such as multiple sclerosis and systemic lupus erythematosus.  Inherited nerve diseases.  Some medicines, such as cancer drugs.  Toxic substances, such as lead and mercury.  Too little blood flowing to the legs.  Kidney disease.  Thyroid disease. What are the signs or symptoms? Different people have different symptoms. The symptoms you have will depend on which of your nerves is damaged. Common symptoms include:  Loss of feeling (numbness) in the feet and hands.  Tingling in the feet and hands.  Pain that burns.  Very  sensitive skin.  Weakness.  Not being able to move a part of the body (paralysis).  Muscle twitching.  Clumsiness or poor coordination.  Loss of balance.  Not being able to control your bladder.  Feeling dizzy.  Sexual problems. How is this diagnosed? Peripheral neuropathy is a symptom, not a disease. Finding the cause of peripheral neuropathy can be hard. To figure that out, your health care provider will take a medical history and do a physical exam. A neurological exam will also be done. This involves checking things affected by your brain, spinal cord, and nerves (nervous system). For example, your health care provider will check your reflexes, how you move, and what you can feel. Other types of tests may also be ordered, such as:  Blood tests.  A test of the fluid in your spinal cord.  Imaging tests, such as CT scans or an MRI.  Electromyography (EMG). This test checks the nerves that control muscles.  Nerve conduction velocity tests. These tests check how fast messages pass  through your nerves.  Nerve biopsy. A small piece of nerve is removed. It is then checked under a microscope. How is this treated?  Medicine is often used to treat peripheral neuropathy. Medicines may include:  Pain-relieving medicines. Prescription or over-the-counter medicine may be suggested.  Antiseizure medicine. This may be used for pain.  Antidepressants. These also may help ease pain from neuropathy.  Lidocaine. This is a numbing medicine. You might wear a patch or be given a shot.  Mexiletine. This medicine is typically used to help control irregular heart rhythms.  Surgery. Surgery may be needed to relieve pressure on a nerve or to destroy a nerve that is causing pain.  Physical therapy to help movement.  Assistive devices to help movement. Follow these instructions at home:  Only take over-the-counter or prescription medicines as directed by your health care provider. Follow  the instructions carefully for any given medicines. Do not take any other medicines without first getting approval from your health care provider.  If you have diabetes, work closely with your health care provider to keep your blood sugar under control.  If you have numbness in your feet:  Check every day for signs of injury or infection. Watch for redness, warmth, and swelling.  Wear padded socks and comfortable shoes. These help protect your feet.  Do not do things that put pressure on your damaged nerve.  Do not smoke. Smoking keeps blood from getting to damaged nerves.  Avoid or limit alcohol. Too much alcohol can cause a lack of B vitamins. These vitamins are needed for healthy nerves.  Develop a good support system. Coping with peripheral neuropathy can be stressful. Talk to a mental health specialist or join a support group if you are struggling.  Follow up with your health care provider as directed. Contact a health care provider if:  You have new signs or symptoms of peripheral neuropathy.  You are struggling emotionally from dealing with peripheral neuropathy.  You have a fever. Get help right away if:  You have an injury or infection that is not healing.  You feel very dizzy or begin vomiting.  You have chest pain.  You have trouble breathing. This information is not intended to replace advice given to you by your health care provider. Make sure you discuss any questions you have with your health care provider. Document Released: 03/21/2002 Document Revised: 09/06/2015 Document Reviewed: 12/06/2012 [employer] Interactive Patient Education  2017 [employer]  Neuropathic Pain Neuropathic pain is pain caused by damage to the nerves that are responsible for certain sensations in your body (sensory nerves). The pain can be caused by damage to:  The sensory nerves that send signals to your spinal cord and brain (peripheral nervous system).  The sensory nerves in your  brain or spinal cord (central nervous system). Neuropathic pain can make you more sensitive to pain. What would be a minor sensation for most people may feel very painful if you have neuropathic pain. This is usually a long-term condition that can be difficult to treat. The type of pain can differ from person to person. It may start suddenly (acute), or it may develop slowly and last for a long time (chronic). Neuropathic pain may come and go as damaged nerves heal or may stay at the same level for years. It often causes emotional distress, loss of sleep, and a lower quality of life. What are the causes? The most common cause of damage to a sensory nerve is diabetes. Many other  diseases and conditions can also cause neuropathic pain. Causes of neuropathic pain can be classified as:  Toxic. Many drugs and chemicals can cause toxic damage. The most common cause of toxic neuropathic pain is damage from drug treatment for cancer (chemotherapy).  Metabolic. This type of pain can happen when a disease causes imbalances that damage nerves. Diabetes is the most common of these diseases. Vitamin B deficiency caused by long-term alcohol abuse is another common cause.  Traumatic. Any injury that cuts, crushes, or stretches a nerve can cause damage and pain. A common example is feeling pain after losing an arm or leg (phantom limb pain).  Compression-related. If a sensory nerve gets trapped or compressed for a long period of time, the blood supply to the nerve can be cut off.  Vascular. Many blood vessel diseases can cause neuropathic pain by decreasing blood supply and oxygen to nerves.  Autoimmune. This type of pain results from diseases in which the body's defense system mistakenly attacks sensory nerves. Examples of autoimmune diseases that can cause neuropathic pain include lupus and multiple sclerosis.  Infectious. Many types of viral infections can damage sensory nerves and cause pain. Shingles infection  is a common cause of this type of pain.  Inherited. Neuropathic pain can be a symptom of many diseases that are passed down through families (genetic). What are the signs or symptoms? The main symptom is pain. Neuropathic pain is often described as:  Burning.  Shock-like.  Stinging.  Hot or cold.  Itching. How is this diagnosed? No single test can diagnose neuropathic pain. Your health care provider will do a physical exam and ask you about your pain. You may use a pain scale to describe how bad your pain is. You may also have tests to see if you have a high sensitivity to pain and to help find the cause and location of any sensory nerve damage. These tests may include:  Imaging studies, such as:  X-rays.  CT scan.  MRI.  Nerve conduction studies to test how well nerve signals travel through your sensory nerves (electrodiagnostic testing).  Stimulating your sensory nerves through electrodes on your skin and measuring the response in your spinal cord and brain (somatosensory evoked potentials). How is this treated? Treatment for neuropathic pain may change over time. You may need to try different treatment options or a combination of treatments. Some options include:  Over-the-counter pain relievers.  Prescription medicines. Some medicines used to treat other conditions may also help neuropathic pain. These include medicines to:  Control seizures (anticonvulsants).  Relieve depression (antidepressants).  Prescription-strength pain relievers (narcotics). These are usually used when other pain relievers do not help.  Transcutaneous nerve stimulation (TENS). This uses electrical currents to block painful nerve signals. The treatment is painless.  Topical and local anesthetics. These are medicines that numb the nerves. They can be injected as a nerve block or applied to the skin.  Alternative treatments, such as:  Acupuncture.  Meditation.  Massage.  Physical  therapy.  Pain management programs.  Counseling. Follow these instructions at home:  Learn as much as you can about your condition.  Take medicines only as directed by your health care provider.  Work closely with all your health care providers to find what works best for you.  Have a good support system at home.  Consider joining a chronic pain support group. Contact a health care provider if:  Your pain treatments are not helping.  You are having side effects from your  medicines.  You are struggling with fatigue, mood changes, depression, or anxiety. This information is not intended to replace advice given to you by your health care provider. Make sure you discuss any questions you have with your health care provider. Document Released: 12/27/2003 Document Revised: 10/19/2015 Document Reviewed: 09/08/2013 Elsevier Interactive Patient Education  2017 [employer].

## 2016-07-30 NOTE — Progress Notes (Signed)
Pre visit review using our clinic review tool, if applicable. No additional management support is needed unless otherwise documented below in the visit note. 

## 2016-08-01 ENCOUNTER — Other Ambulatory Visit: Payer: Self-pay | Admitting: Internal Medicine

## 2016-08-01 ENCOUNTER — Telehealth: Payer: Self-pay | Admitting: Internal Medicine

## 2016-08-01 ENCOUNTER — Encounter: Payer: Self-pay | Admitting: Internal Medicine

## 2016-08-01 MED ORDER — METFORMIN HCL ER 500 MG PO TB24: 500.0000 mg | ORAL_TABLET | Freq: Every day | ORAL | Status: DC

## 2016-08-01 MED ORDER — METFORMIN HCL ER 500 MG PO TB24: 500.0000 mg | ORAL_TABLET | Freq: Every day | ORAL | 1 refills | Status: DC

## 2016-08-01 NOTE — Telephone Encounter (Signed)
Notified patient the laboratory studies confirm diabetes. Prescription for metformin extended release 500 mg #180 directions to every morning; was called in to pts pharmacy. 08/11/16 @ 3:15pm scheduled for a follow-up office visit. Pt verbalized understanding

## 2016-08-01 NOTE — Progress Notes (Signed)
Subjective:    Patient ID: Jerry Beasley, male    DOB: 06-30-1945, 71 y.o.   MRN: [id number]  HPI 71 year old patient who has a history of essential hypertension, as well as impaired glucose tolerance.  He presents with a chief complaint of numbness involving his feet for "a few years".  He also describes a discomfort involving the plantar surface of his feet that interferes with daily activities such as walking and exercise.  Hemoglobin A1c's have been normal. No new medications.  He also has a history of dyslipidemia  Past Medical History:  Diagnosis Date  . BENIGN PROSTATIC HYPERTROPHY, MILD, HX OF 10/21/2006  . HYPERLIPIDEMIA 10/21/2006  . HYPERTENSION 10/21/2006  . NEPHROLITHIASIS, HX OF 08/30/2007  . 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 Marital status: Married    Spouse name: N/A  . Number of children: N/A  . Years of education: N/A   Occupational History  . Not on file.   Social History Main Topics  . Smoking status: Never Smoker  . Smokeless tobacco: Never Used  . Alcohol use Yes     Comment: rarely  . Drug use: No  . Sexual activity: Not on file   Other Topics Concern  . Not on file   Social History Narrative  . No narrative on file    Past Surgical History:  Procedure Laterality Date  . KNEE SURGERY    . TONSILLECTOMY    . TOTAL KNEE ARTHROPLASTY      Family History  Problem Relation Age of Onset  . Hypertension Neg Hx     family hx    No Known Allergies  Current Outpatient Prescriptions on File Prior to Visit  Medication Sig Dispense Refill  . aspirin 81 MG tablet Take 81 mg by mouth daily.    . Multiple Vitamin (MULTI-VITAMIN DAILY PO) Take 1 tablet by mouth daily.     No current facility-administered medications on file prior to visit.     BP (!) 148/82 (BP Location: Left Arm, Patient Position: Sitting, Cuff Size: Normal)   Pulse 100   Temp 97.9 F (36.6 C) (Oral)   Ht  (1.753 m)   Wt 249 lb 12.8 oz  (113.3 kg)   SpO2 97%   BMI 36.89 kg/m      Review of Systems  Constitutional: Negative for appetite change, chills, fatigue and fever.  HENT: Negative for congestion, dental problem, ear pain, hearing loss, sore throat, tinnitus, trouble swallowing and voice change.   Eyes: Negative for pain, discharge and visual disturbance.  Respiratory: Negative for cough, chest tightness, wheezing and stridor.   Cardiovascular: Negative for chest pain, palpitations and leg swelling.  Gastrointestinal: Negative for abdominal distention, abdominal pain, blood in stool, constipation, diarrhea, nausea and vomiting.  Genitourinary: Negative for difficulty urinating, discharge, flank pain, genital sores, hematuria and urgency.  Musculoskeletal: Negative for arthralgias, back pain, gait problem, joint swelling, myalgias and neck stiffness.  Skin: Negative for rash.  Neurological: Positive for numbness. Negative for dizziness, syncope, speech difficulty, weakness and headaches.  Hematological: Negative for adenopathy. Does not bruise/bleed easily.  Psychiatric/Behavioral: Negative for behavioral problems and dysphoric mood. The patient is not nervous/anxious.        Objective:   Physical Exam  Constitutional: He is oriented to person, place, and time. He appears well-developed.  HENT:  Head: Normocephalic.  Right Ear: External ear normal.  Left Ear: External ear normal.  Eyes: Conjunctivae and  EOM are normal.  Neck: Normal range of motion.  Cardiovascular: Normal rate and normal heart sounds.   Pulmonary/Chest: Breath sounds normal.  Abdominal: Bowel sounds are normal.  Musculoskeletal: Normal range of motion. He exhibits no edema or tenderness.  Neurological: He is alert and oriented to person, place, and time.  Diminished vibratory sensation distally Intact monofilament testing Proprioception intact Reflexes blunted but symmetrical  Psychiatric: He has a normal mood and affect. His behavior  is normal.          Assessment & Plan:   Peripheral neuropathy.  Long history of impaired glucose tolerance.  Will check general lab including hemoglobin A1c and B12 level Essential hypertension Impaired glucose tolerance  CPX 2 months  Trial Cymbalta  Rogelia Boga

## 2016-08-01 NOTE — Telephone Encounter (Signed)
Pt returning your call. Please call back thanks!

## 2016-08-11 ENCOUNTER — Encounter: Payer: Self-pay | Admitting: Internal Medicine

## 2016-08-11 ENCOUNTER — Ambulatory Visit (INDEPENDENT_AMBULATORY_CARE_PROVIDER_SITE_OTHER): Payer: Medicare Other | Admitting: Internal Medicine

## 2016-08-11 VITALS — BP 148/76 | HR 99 | Temp 98.4°F | Wt 250.2 lb

## 2016-08-11 DIAGNOSIS — R5081 Fever presenting with conditions classified elsewhere: Secondary | ICD-10-CM

## 2016-08-11 DIAGNOSIS — E1140 Type 2 diabetes mellitus with diabetic neuropathy, unspecified: Secondary | ICD-10-CM | POA: Diagnosis not present

## 2016-08-11 DIAGNOSIS — I10 Essential (primary) hypertension: Secondary | ICD-10-CM

## 2016-08-11 DIAGNOSIS — E1142 Type 2 diabetes mellitus with diabetic polyneuropathy: Secondary | ICD-10-CM | POA: Insufficient documentation

## 2016-08-11 MED ORDER — METFORMIN HCL ER 500 MG PO TB24: ORAL_TABLET | ORAL | 1 refills | Status: DC

## 2016-08-11 MED ORDER — CEFTRIAXONE SODIUM 1 G IJ SOLR
1.0000 g | Freq: Once | INTRAMUSCULAR | Status: AC
  Administered 2016-08-11: 1 g via INTRAMUSCULAR

## 2016-08-11 NOTE — Progress Notes (Signed)
Pre visit review using our clinic review tool, if applicable. No additional management support is needed unless otherwise documented below in the visit note. 

## 2016-08-11 NOTE — Patient Instructions (Addendum)
Limit your sodium (Salt) intake    It is important that you exercise regularly, at least 20 minutes 3 to 4 times per week.  If you develop chest pain or shortness of breath seek  medical attention.  You need to lose weight.  Consider a lower calorie diet and regular exercise.   Please check your hemoglobin A1c every 3 months  Slowly increase metformin to 2 tablets twice daily   Diabetes Mellitus and Exercise Exercising regularly is important for your overall health, especially when you have diabetes (diabetes mellitus). Exercising is not only about losing weight. It has many health benefits, such as increasing muscle strength and bone density and reducing body fat and stress. This leads to improved fitness, flexibility, and endurance, all of which result in better overall health. Exercise has additional benefits for people with diabetes, including:  Reducing appetite.  Helping to lower and control blood glucose.  Lowering blood pressure.  Helping to control amounts of fatty substances (lipids) in the blood, such as cholesterol and triglycerides.  Helping the body to respond better to insulin (improving insulin sensitivity).  Reducing how much insulin the body needs.  Decreasing the risk for heart disease by:  Lowering cholesterol and triglyceride levels.  Increasing the levels of good cholesterol.  Lowering blood glucose levels. What is my activity plan? Your health care provider or certified diabetes educator can help you make a plan for the type and frequency of exercise (activity plan) that works for you. Make sure that you:  Do at least 150 minutes of moderate-intensity or vigorous-intensity exercise each week. This could be brisk walking, biking, or water aerobics.  Do stretching and strength exercises, such as yoga or weightlifting, at least 2 times a week.  Spread out your activity over at least 3 days of the week.  Get some form of physical activity every day.  Do  not go more than 2 days in a row without some kind of physical activity.  Avoid being inactive for more than 90 minutes at a time. Take frequent breaks to walk or stretch.  Choose a type of exercise or activity that you enjoy, and set realistic goals.  Start slowly, and gradually increase the intensity of your exercise over time. What do I need to know about managing my diabetes?  Check your blood glucose before and after exercising.  If your blood glucose is higher than 240 mg/dL (13.3 mmol/L) before you exercise, check your urine for ketones. If you have ketones in your urine, do not exercise until your blood glucose returns to normal.  Know the symptoms of low blood glucose (hypoglycemia) and how to treat it. Your risk for hypoglycemia increases during and after exercise. Common symptoms of hypoglycemia can include:  Hunger.  Anxiety.  Sweating and feeling clammy.  Confusion.  Dizziness or feeling light-headed.  Increased heart rate or palpitations.  Blurry vision.  Tingling or numbness around the mouth, lips, or tongue.  Tremors or shakes.  Irritability.  Keep a rapid-acting carbohydrate snack available before, during, and after exercise to help prevent or treat hypoglycemia.  Avoid injecting insulin into areas of the body that are going to be exercised. For example, avoid injecting insulin into:  The arms, when playing tennis.  The legs, when jogging.  Keep records of your exercise habits. Doing this can help you and your health care provider adjust your diabetes management plan as needed. Write down:  Food that you eat before and after you exercise.  Blood glucose  levels before and after you exercise.  The type and amount of exercise you have done.  When your insulin is expected to peak, if you use insulin. Avoid exercising at times when your insulin is peaking.  When you start a new exercise or activity, work with your health care provider to make sure the  activity is safe for you, and to adjust your insulin, medicines, or food intake as needed.  Drink plenty of water while you exercise to prevent dehydration or heat stroke. Drink enough fluid to keep your urine clear or pale yellow. This information is not intended to replace advice given to you by your health care provider. Make sure you discuss any questions you have with your health care provider. Document Released: 06/21/2003 Document Revised: 10/19/2015 Document Reviewed: 09/10/2015 Elsevier Interactive Patient Education  2017 [place]. Diabetes Mellitus and Food It is important for you to manage your blood sugar (glucose) level. Your blood glucose level can be greatly affected by what you eat. Eating healthier foods in the appropriate amounts throughout the day at about the same time each day will help you control your blood glucose level. It can also help slow or prevent worsening of your diabetes mellitus. Healthy eating may even help you improve the level of your blood pressure and reach or maintain a healthy weight. General recommendations for healthful eating and cooking habits include:  Eating meals and snacks regularly. Avoid going long periods of time without eating to lose weight.  Eating a diet that consists mainly of plant-based foods, such as fruits, vegetables, nuts, legumes, and whole grains.  Using low-heat cooking methods, such as baking, instead of high-heat cooking methods, such as deep frying. Work with your dietitian to make sure you understand how to use the Nutrition Facts information on food labels. How can food affect me? Carbohydrates  Carbohydrates affect your blood glucose level more than any other type of food. Your dietitian will help you determine how many carbohydrates to eat at each meal and teach you how to count carbohydrates. Counting carbohydrates is important to keep your blood glucose at a healthy level, especially if you are using insulin or taking  certain medicines for diabetes mellitus. Alcohol  Alcohol can cause sudden decreases in blood glucose (hypoglycemia), especially if you use insulin or take certain medicines for diabetes mellitus. Hypoglycemia can be a life-threatening condition. Symptoms of hypoglycemia (sleepiness, dizziness, and disorientation) are similar to symptoms of having too much alcohol. If your health care provider has given you approval to drink alcohol, do so in moderation and use the following guidelines:  Women should not have more than one drink per day, and men should not have more than two drinks per day. One drink is equal to:  12 oz of beer.  5 oz of wine.  1 oz of hard liquor.  Do not drink on an empty stomach.  Keep yourself hydrated. Have water, diet soda, or unsweetened iced tea.  Regular soda, juice, and other mixers might contain a lot of carbohydrates and should be counted. What foods are not recommended? As you make food choices, it is important to remember that all foods are not the same. Some foods have fewer nutrients per serving than other foods, even though they might have the same number of calories or carbohydrates. It is difficult to get your body what it needs when you eat foods with fewer nutrients. Examples of foods that you should avoid that are high in calories and carbohydrates but  low in nutrients include:  Trans fats (most processed foods list trans fats on the Nutrition Facts label).  Regular soda.  Juice.  Candy.  Sweets, such as cake, pie, doughnuts, and cookies.  Fried foods. What foods can I eat? Eat nutrient-rich foods, which will nourish your body and keep you healthy. The food you should eat also will depend on several factors, including:  The calories you need.  The medicines you take.  Your weight.  Your blood glucose level.  Your blood pressure level.  Your cholesterol level. You should eat a variety of foods, including:  Protein.  Lean cuts of  meat.  Proteins low in saturated fats, such as fish, egg whites, and beans. Avoid processed meats.  Fruits and vegetables.  Fruits and vegetables that may help control blood glucose levels, such as apples, mangoes, and yams.  Dairy products.  Choose fat-free or low-fat dairy products, such as milk, yogurt, and cheese.  Grains, bread, pasta, and rice.  Choose whole grain products, such as multigrain bread, whole oats, and brown rice. These foods may help control blood pressure.  Fats.  Foods containing healthful fats, such as nuts, avocado, olive oil, canola oil, and fish. Does everyone with diabetes mellitus have the same meal plan? Because every person with diabetes mellitus is different, there is not one meal plan that works for everyone. It is very important that you meet with a dietitian who will help you create a meal plan that is just right for you. This information is not intended to replace advice given to you by your health care provider. Make sure you discuss any questions you have with your health care provider. Document Released: 12/26/2004 Document Revised: 09/06/2015 Document Reviewed: 02/25/2013 Elsevier Interactive Patient Education  2017 [employer]  Peripheral Neuropathy Peripheral neuropathy is a type of nerve damage. It affects nerves that carry signals between the spinal cord and other parts of the body. These are called peripheral nerves. With peripheral neuropathy, one nerve or a group of nerves may be damaged. What are the causes? Many things can damage peripheral nerves. For some people with peripheral neuropathy, the cause is unknown. Some causes include:  Diabetes. This is the most common cause of peripheral neuropathy.  Injury to a nerve.  Pressure or stress on a nerve that lasts a long time.  Too little vitamin B. Alcoholism can lead to this.  Infections.  Autoimmune diseases, such as multiple sclerosis and systemic lupus  erythematosus.  Inherited nerve diseases.  Some medicines, such as cancer drugs.  Toxic substances, such as lead and mercury.  Too little blood flowing to the legs.  Kidney disease.  Thyroid disease. What are the signs or symptoms? Different people have different symptoms. The symptoms you have will depend on which of your nerves is damaged. Common symptoms include:  Loss of feeling (numbness) in the feet and hands.  Tingling in the feet and hands.  Pain that burns.  Very sensitive skin.  Weakness.  Not being able to move a part of the body (paralysis).  Muscle twitching.  Clumsiness or poor coordination.  Loss of balance.  Not being able to control your bladder.  Feeling dizzy.  Sexual problems. How is this diagnosed? Peripheral neuropathy is a symptom, not a disease. Finding the cause of peripheral neuropathy can be hard. To figure that out, your health care provider will take a medical history and do a physical exam. A neurological exam will also be done. This involves checking things affected  by your brain, spinal cord, and nerves (nervous system). For example, your health care provider will check your reflexes, how you move, and what you can feel. Other types of tests may also be ordered, such as:  Blood tests.  A test of the fluid in your spinal cord.  Imaging tests, such as CT scans or an MRI.  Electromyography (EMG). This test checks the nerves that control muscles.  Nerve conduction velocity tests. These tests check how fast messages pass through your nerves.  Nerve biopsy. A small piece of nerve is removed. It is then checked under a microscope. How is this treated?  Medicine is often used to treat peripheral neuropathy. Medicines may include:  Pain-relieving medicines. Prescription or over-the-counter medicine may be suggested.  Antiseizure medicine. This may be used for pain.  Antidepressants. These also may help ease pain from  neuropathy.  Lidocaine. This is a numbing medicine. You might wear a patch or be given a shot.  Mexiletine. This medicine is typically used to help control irregular heart rhythms.  Surgery. Surgery may be needed to relieve pressure on a nerve or to destroy a nerve that is causing pain.  Physical therapy to help movement.  Assistive devices to help movement. Follow these instructions at home:  Only take over-the-counter or prescription medicines as directed by your health care provider. Follow the instructions carefully for any given medicines. Do not take any other medicines without first getting approval from your health care provider.  If you have diabetes, work closely with your health care provider to keep your blood sugar under control.  If you have numbness in your feet:  Check every day for signs of injury or infection. Watch for redness, warmth, and swelling.  Wear padded socks and comfortable shoes. These help protect your feet.  Do not do things that put pressure on your damaged nerve.  Do not smoke. Smoking keeps blood from getting to damaged nerves.  Avoid or limit alcohol. Too much alcohol can cause a lack of B vitamins. These vitamins are needed for healthy nerves.  Develop a good support system. Coping with peripheral neuropathy can be stressful. Talk to a mental health specialist or join a support group if you are struggling.  Follow up with your health care provider as directed. Contact a health care provider if:  You have new signs or symptoms of peripheral neuropathy.  You are struggling emotionally from dealing with peripheral neuropathy.  You have a fever. Get help right away if:  You have an injury or infection that is not healing.  You feel very dizzy or begin vomiting.  You have chest pain.  You have trouble breathing. This information is not intended to replace advice given to you by your health care provider. Make sure you discuss any  questions you have with your health care provider. Document Released: 03/21/2002 Document Revised: 09/06/2015 Document Reviewed: 12/06/2012 Elsevier Interactive Patient Education  2017 [place] for Eating Away From Home If You Have Diabetes Controlling your level of blood glucose, also known as blood sugar, can be challenging. It can be even more difficult when you do not prepare your own meals. The following tips can help you manage your diabetes when you eat away from home. Planning ahead Plan ahead if you know you will be eating away from home:  Ask your health care provider how to time meals and medicine if you are taking insulin.  Make a list of restaurants near you that offer  healthy choices. If they have a carry-out menu, take it home and plan what you will order ahead of time.  Look up the restaurant you want to eat at online. Many chain and fast-food restaurants list nutritional information online. Use this information to choose the healthiest options and to calculate how many carbohydrates will be in your meal.  Use a carbohydrate-counting book or mobile app to look up the carbohydrate content and serving size of the foods you want to eat.  Become familiar with serving sizes and learn to recognize how many servings are in a portion. This will allow you to estimate how many carbohydrates you can eat. Free foods A "free food" is any food or drink that has less than 5 g of carbohydrates per serving. Free foods include:  Many vegetables.  Hard boiled eggs.  Nuts or seeds.  Olives.  Cheeses.  Meats. These types of foods make good appetizer choices and are often available at salad bars. Lemon juice, vinegar, or a low-calorie salad dressing of fewer than 20 calories per serving can be used as a "free" salad dressing. Choices to reduce carbohydrates  Substitute nonfat sweetened yogurt with a sugar-free yogurt. Yogurt made from soy milk may also be used, but you will  still want a sugar-free or plain option to choose a lower carbohydrate amount.  Ask your server to take away the bread basket or chips from your table.  Order fresh fruit. A salad bar often offers fresh fruit choices. Avoid canned fruit because it is usually packed in sugar or syrup.  Order a salad, and eat it without dressing. Or, create a "free" salad dressing.  Ask for substitutions. For example, instead of [place] fries, request an order of a vegetable such as salad, green beans, or broccoli. Other tips  If you take insulin, take the insulin once your food arrives to your table. This will ensure your insulin and food are timed correctly.  Ask your server about the portion size before your order, and ask for a take-out box if the portion has more servings than you should have. When your food comes, leave the amount you should have on the plate, and put the rest in the take-out box.  Consider splitting an entree with someone and ordering a side salad. This information is not intended to replace advice given to you by your health care provider. Make sure you discuss any questions you have with your health care provider. Document Released: 03/31/2005 Document Revised: 09/06/2015 Document Reviewed: 06/28/2013 Elsevier Interactive Patient Education  2017 [place].  Type 2 Diabetes Mellitus, Self Care, Adult Caring for yourself after you have been diagnosed with type 2 diabetes (type 2 diabetes mellitus) means keeping your blood sugar (glucose) under control with a balance of:  Nutrition.  Exercise.  Lifestyle changes.  Medicines or insulin, if necessary.  Support from your team of health care providers and others. The following information explains what you need to know to manage your diabetes at home. What do I need to do to manage my blood glucose?  Check your blood glucose every day, as often as told by your health care provider.  Contact your health care provider if your  blood glucose is above your target for 2 tests in a row.  Have your A1c (hemoglobin A1c) level checked at least two times a year, or as often as told by your health care provider. Your health care provider will set individualized treatment goals for you. Generally, the goal  of treatment is to maintain the following blood glucose levels:  Before meals (preprandial): 80-130 mg/dL (4.4-7.2 mmol/L).  After meals (postprandial): below 180 mg/dL (10 mmol/L).  A1c level: less than 7%. What do I need to know about hyperglycemia and hypoglycemia? What is hyperglycemia?  Hyperglycemia, also called high blood glucose, occurs when blood glucose is too high.Make sure you know the early signs of hyperglycemia, such as:  Increased thirst.  Hunger.  Feeling very tired.  Needing to urinate more often than usual.  Blurry vision. What is hypoglycemia?  Hypoglycemia, also called low blood glucose, occurswith a blood glucose level at or below 70 mg/dL (3.9 mmol/L). The risk for hypoglycemia increases during or after exercise, during sleep, during illness, and when skipping meals or not eating for a long time (fasting). It is important to know the symptoms of hypoglycemia and treat it right away. Always have a 15-gram rapid-acting carbohydrate snack with you to treat low blood glucose. Family members and close friends should also know the symptoms and should understand how to treat hypoglycemia, in case you are not able to treat yourself. What are the symptoms of hypoglycemia?  Hypoglycemia symptoms can include:  Hunger.  Anxiety.  Sweating and feeling clammy.  Confusion.  Dizziness or feeling light-headed.  Sleepiness.  Nausea.  Increased heart rate.  Headache.  Blurry vision.  Seizure.  Nightmares.  Tingling or numbness around the mouth, lips, or tongue.  A change in speech.  Decreased ability to concentrate.  A change in coordination.  Restless sleep.  Tremors or  shakes.  Fainting.  Irritability. How do I treat hypoglycemia?   If you are alert and able to swallow safely, follow the 15:15 rule:  Take 15 grams of a rapid-acting carbohydrate. Rapid-acting options include:  1 tube of glucose gel.  3 glucose pills.  6-8 pieces of hard candy.  4 oz (120 mL) of fruit juice.  4 oz (120 mL) of regular (not diet) soda.  Check your blood glucose 15 minutes after you take the carbohydrate.  If the repeat blood glucose level is still at or below 70 mg/dL (3.9 mmol/L), take 15 grams of a carbohydrate again.  If your blood glucose level does not increase above 70 mg/dL (3.9 mmol/L) after 3 tries, seek emergency medical care.  After your blood glucose level returns to normal, eat a meal or a snack within 1 hour. How do I treat severe hypoglycemia?  Severe hypoglycemia is when your blood glucose level is at or below 54 mg/dL (3 mmol/L). Severe hypoglycemia is an emergency. Do not wait to see if the symptoms will go away. Get medical help right away. Call your local emergency services (911 in the [place] Do not drive yourself to the hospital. If you have severe hypoglycemia and you cannot eat or drink, you may need an injection of glucagon. A family member or close friend should learn how to check your blood glucose and how to give you a glucagon injection. Ask your health care provider if you need to have an emergency glucagon injection kit available. Severe hypoglycemia may need to be treated in a hospital. The treatment may include getting glucose through an IV tube. You may also need treatment for the cause of your hypoglycemia. Can having diabetes put me at risk for other conditions? Having diabetes can put you at risk for other long-term (chronic) conditions, such as heart disease and kidney disease. Your health care provider may prescribe medicines to help prevent complications from diabetes.  These medicines may include:  Aspirin.  Medicine to lower  cholesterol.  Medicine to control blood pressure. What else can I do to manage my diabetes? Take your diabetes medicines as told   If your health care provider prescribed insulin or diabetes medicines, take them every day.  Do not run out of insulin or other diabetes medicines that you take. Plan ahead so you always have these available.  If you use insulin, adjust your dosage based on how physically active you are and what foods you eat. Your health care provider will tell you how to adjust your dosage. Make healthy food choices   The things that you eat and drink affect your blood glucose and your insulin dosage. Making good choices helps to control your diabetes and prevent other health problems. A healthy meal plan includes eating lean proteins, complex carbohydrates, fresh fruits and vegetables, low-fat dairy products, and healthy fats. Make an appointment to see a diet and nutrition specialist (registered dietitian) to help you create an eating plan that is right for you. Make sure that you:  Follow instructions from your health care provider about eating or drinking restrictions.  Drink enough fluid to keep your urine clear or pale yellow.  Eat healthy snacks between nutritious meals.  Track the carbohydrates that you eat. Do this by reading food labels and learning the standard serving sizes of foods.  Follow your sick day plan whenever you cannot eat or drink as usual. Make this plan in advance with your health care provider. Stay active   Exercise regularly, as told by your health care provider. This may include:  Stretching and doing strength exercises, such as yoga or weightlifting, at least 2 times a week.  Doing at least 150 minutes of moderate-intensity or vigorous-intensity exercise each week. This could be brisk walking, biking, or water aerobics.  Spread out your activity over at least 3 days of the week.  Do not go more than 2 days in a row without doing some  kind of physical activity. When you start a new exercise or activity, work with your health care provider to adjust your insulin, medicines, or food intake as needed. Make healthy lifestyle choices   Do not use any tobacco products, such as cigarettes, chewing tobacco, and e-cigarettes. If you need help quitting, ask your health care provider.  If your health care provider says that alcohol is safe for you, limit alcohol intake to no more than 1 drink per day for nonpregnant women and 2 drinks per day for men. One drink equals 12 oz of beer, 5 oz of wine, or 1 oz of hard liquor.  Learn to manage stress. If you need help with this, ask your health care provider. Care for your body    Keep your immunizations up to date. In addition to getting vaccinations as told by your health care provider, it is recommended that you get vaccinated against the following illnesses:  The flu (influenza). Get a flu shot every year.  Pneumonia.  Hepatitis B.  Schedule an eye exam soon after your diagnosis, and then one time every year after that.  Check your skin and feet every day for cuts, bruises, redness, blisters, or sores. Schedule a foot exam with your health care provider once every year.  Brush your teeth and gums two times a day, and floss at least one time a day. Visit your dentist at least once every 6 months.  Maintain a healthy weight. General instructions  Take over-the-counter and prescription medicines only as told by your health care provider.  Share your diabetes management plan with people in your workplace, school, and household.  Check your urine for ketones when you are ill and as told by your health care provider.  Ask your health care provider:  Do I need to meet with a diabetes educator?  Where can I find a support group for people with diabetes?  Carry a medical alert card or wear medical alert jewelry.  Keep all follow-up visits as told by your health care  provider. This is important. Where to find more information: For more information about diabetes, visit:  American Diabetes Association (ADA): www.diabetes.org  American Association of Diabetes Educators (AADE): www.diabeteseducator.org/patient-resources This information is not intended to replace advice given to you by your health care provider. Make sure you discuss any questions you have with your health care provider. Document Released: 07/23/2015 Document Revised: 09/06/2015 Document Reviewed: 05/04/2015 Elsevier Interactive Patient Education  2017 [employer].   Please see your eye doctor yearly to check for diabetic eye damage

## 2016-08-11 NOTE — Progress Notes (Signed)
Subjective:    Patient ID: Jerry Beasley, male    DOB: 1945/07/07, 71 y.o.   MRN: [id number]  HPI 71 year old patient who is seen today in follow-up following a recent diagnosis of diabetes.  He does have a history of impaired glucose tolerance  Lab Results  Component Value Date   HGBA1C 9.2 (H) 07/30/2016   He generally feels well.  He was placed on Cymbalta for neuropathic pain.  He also is on low-dose metformin, which she continues to tolerate well.  Past Medical History:  Diagnosis Date  . BENIGN PROSTATIC HYPERTROPHY, MILD, HX OF 10/21/2006  . HYPERLIPIDEMIA 10/21/2006  . HYPERTENSION 10/21/2006  . NEPHROLITHIASIS, HX OF 08/30/2007  . 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 Marital status: Married    Spouse name: N/A  . Number of children: N/A  . Years of education: N/A   Occupational History  . Not on file.   Social History Main Topics  . Smoking status: Never Smoker  . Smokeless tobacco: Never Used  . Alcohol use Yes     Comment: rarely  . Drug use: No  . Sexual activity: Not on file   Other Topics Concern  . Not on file   Social History Narrative  . No narrative on file    Past Surgical History:  Procedure Laterality Date  . KNEE SURGERY    . TONSILLECTOMY    . TOTAL KNEE ARTHROPLASTY      Family History  Problem Relation Age of Onset  . Hypertension Neg Hx     family hx    No Known Allergies  Current Outpatient Prescriptions on File Prior to Visit  Medication Sig Dispense Refill  . amLODipine (NORVASC) 10 MG tablet Take 1 tablet (10 mg total) by mouth daily. 90 tablet 0  . aspirin 81 MG tablet Take 81 mg by mouth daily.    . benazepril (LOTENSIN) 40 MG tablet Take 1 tablet (40 mg total) by mouth daily. 90 tablet 1  . DULoxetine (CYMBALTA) 30 MG capsule Take 1 capsule (30 mg total) by mouth daily. 90 capsule 2  . Multiple Vitamin (MULTI-VITAMIN DAILY PO) Take 1 tablet by mouth daily.    . simvastatin (ZOCOR) 40 MG  tablet Take 1 tablet (40 mg total) by mouth daily. 30 tablet 1  . traMADol (ULTRAM) 50 MG tablet Take 1 tablet (50 mg total) by mouth every 6 (six) hours as needed. 60 tablet 0   No current facility-administered medications on file prior to visit.     BP (!) 148/76 (BP Location: Left Arm, Patient Position: Sitting, Cuff Size: Normal)   Pulse 99   Temp 98.4 F (36.9 C) (Oral)   Wt 250 lb 3.2 oz (113.5 kg)   SpO2 96%   BMI 36.95 kg/m      Review of Systems  Constitutional: Negative for appetite change, chills, fatigue and fever.  HENT: Negative for congestion, dental problem, ear pain, hearing loss, sore throat, tinnitus, trouble swallowing and voice change.   Eyes: Negative for pain, discharge and visual disturbance.  Respiratory: Negative for cough, chest tightness, wheezing and stridor.   Cardiovascular: Negative for chest pain, palpitations and leg swelling.  Gastrointestinal: Negative for abdominal distention, abdominal pain, blood in stool, constipation, diarrhea, nausea and vomiting.  Endocrine: Negative for polydipsia, polyphagia and polyuria.  Genitourinary: Negative for difficulty urinating, discharge, flank pain, genital sores, hematuria and urgency.  Musculoskeletal: Negative for arthralgias, back pain, gait  problem, joint swelling, myalgias and neck stiffness.  Skin: Negative for rash.  Neurological: Negative for dizziness, syncope, speech difficulty, weakness, numbness and headaches.  Hematological: Negative for adenopathy. Does not bruise/bleed easily.  Psychiatric/Behavioral: Negative for behavioral problems and dysphoric mood. The patient is not nervous/anxious.        Objective:   Physical Exam  Constitutional: He appears well-developed and well-nourished. No distress.  Blood pressure 124/70           Assessment & Plan:   Recent onset diabetes type 2.  Patient was instructed on use of a glucometer and glucometer supplied.  Will increase metformin to 2 g  daily in divided dosages over the next few weeks.  Return in one month for follow-up Patient given considerable general and dietary diabetic instructions.  He will consider dietary referral Essential hypertension, stable Dyslipidemia.  Continue statin therapy Diabetic peripheral neuropathy.  Continue Cymbalta  Return in one month for follow-up  Rogelia Boga

## 2016-08-13 ENCOUNTER — Other Ambulatory Visit: Payer: Self-pay | Admitting: Internal Medicine

## 2016-08-14 ENCOUNTER — Telehealth: Payer: Self-pay | Admitting: Internal Medicine

## 2016-08-14 NOTE — Telephone Encounter (Signed)
° ° ° ° ° °  Pt rweveiced a accu check meter here in the office and now he need the test strips called in to the below pharmacy    [facility] [place]

## 2016-08-15 MED ORDER — GLUCOSE BLOOD VI STRP: ORAL_STRIP | 12 refills | Status: AC

## 2016-08-15 NOTE — Telephone Encounter (Signed)
Test strips were sent in to pt's strips

## 2016-09-10 ENCOUNTER — Encounter: Payer: Self-pay | Admitting: Internal Medicine

## 2016-09-10 ENCOUNTER — Ambulatory Visit (INDEPENDENT_AMBULATORY_CARE_PROVIDER_SITE_OTHER): Payer: Medicare Other | Admitting: Internal Medicine

## 2016-09-10 VITALS — BP 112/68 | HR 104 | Temp 98.3°F | Ht 69.0 in | Wt 246.2 lb

## 2016-09-10 DIAGNOSIS — E1142 Type 2 diabetes mellitus with diabetic polyneuropathy: Secondary | ICD-10-CM

## 2016-09-10 DIAGNOSIS — I10 Essential (primary) hypertension: Secondary | ICD-10-CM

## 2016-09-10 NOTE — Patient Instructions (Addendum)
WE NOW OFFER   [facility] FAST TRACK!!!  SAME DAY Appointments for ACUTE CARE  Such as: Sprains, Injuries, cuts, abrasions, rashes, muscle pain, joint pain, back pain Colds, flu, sore throats, headache, allergies, cough, fever  Ear pain, sinus and eye infections Abdominal pain, nausea, vomiting, diarrhea, upset stomach Animal/insect bites  3 Easy Ways to Schedule: Walk-In Scheduling Call in scheduling Mychart Sign-up: [email or web address]    Please check your hemoglobin A1c every 3 months  Limit your sodium (Salt) intake    It is important that you exercise regularly, at least 20 minutes 3 to 4 times per week.  If you develop chest pain or shortness of breath seek  medical attention.  You need to lose weight.  Consider a lower calorie diet and regular exercise.

## 2016-09-10 NOTE — Progress Notes (Signed)
Subjective:    Patient ID: Jerry Beasley, male    DOB: 04-Jun-1945, 71 y.o.   MRN: [id number]  HPI 71 year old patient with a long history of impaired glucose tolerance and a fairly recent diagnosis of type 2 diabetes. He now is on full dose metformin, which he tolerates well.  He continues to lose weight and is on a more prudent diet.  He has enjoyed much improved glycemic control.  The continues to improve weekly  Past Medical History:  Diagnosis Date  . BENIGN PROSTATIC HYPERTROPHY, MILD, HX OF 10/21/2006  . HYPERLIPIDEMIA 10/21/2006  . HYPERTENSION 10/21/2006  . NEPHROLITHIASIS, HX OF 08/30/2007  . 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 Marital status: Married    Spouse name: N/A  . Number of children: N/A  . Years of education: N/A   Occupational History  . Not on file.   Social History Main Topics  . Smoking status: Never Smoker  . Smokeless tobacco: Never Used  . Alcohol use Yes     Comment: rarely  . Drug use: No  . Sexual activity: Not on file   Other Topics Concern  . Not on file   Social History Narrative  . No narrative on file    Past Surgical History:  Procedure Laterality Date  . KNEE SURGERY    . TONSILLECTOMY    . TOTAL KNEE ARTHROPLASTY      Family History  Problem Relation Age of Onset  . Hypertension Neg Hx        family hx    No Known Allergies  Current Outpatient Prescriptions on File Prior to Visit  Medication Sig Dispense Refill  . amLODipine (NORVASC) 10 MG tablet Take 1 tablet (10 mg total) by mouth daily. 90 tablet 0  . aspirin 81 MG tablet Take 81 mg by mouth daily.    . benazepril (LOTENSIN) 40 MG tablet Take 1 tablet (40 mg total) by mouth daily. 90 tablet 1  . DULoxetine (CYMBALTA) 30 MG capsule Take 1 capsule (30 mg total) by mouth daily. 90 capsule 2  . glucose blood test strip Check blood sugar TID and prn. Dx E11.9 100 each 12  . metFORMIN (GLUCOPHAGE XR) 500 MG 24 hr tablet 2 tablets twice  daily 180 tablet 1  . Multiple Vitamin (MULTI-VITAMIN DAILY PO) Take 1 tablet by mouth daily.    . simvastatin (ZOCOR) 40 MG tablet TAKE ONE TABLET BY MOUTH ONCE DAILY 30 tablet 1  . traMADol (ULTRAM) 50 MG tablet Take 1 tablet (50 mg total) by mouth every 6 (six) hours as needed. (Patient not taking: Reported on 09/10/2016) 60 tablet 0   No current facility-administered medications on file prior to visit.     BP 112/68 (BP Location: Left Arm, Patient Position: Sitting, Cuff Size: Normal)   Pulse (!) 104   Temp 98.3 F (36.8 C) (Oral)   Ht 5\' 9"  (1.753 m)   Wt 246 lb 3.2 oz (111.7 kg)   SpO2 97%   BMI 36.36 kg/m      Review of Systems  Constitutional: Negative for appetite change, chills, fatigue and fever.  HENT: Negative for congestion, dental problem, ear pain, hearing loss, sore throat, tinnitus, trouble swallowing and voice change.   Eyes: Negative for pain, discharge and visual disturbance.  Respiratory: Negative for cough, chest tightness, wheezing and stridor.   Cardiovascular: Negative for chest pain, palpitations and leg swelling.  Gastrointestinal: Negative for abdominal distention,  abdominal pain, blood in stool, constipation, diarrhea, nausea and vomiting.  Genitourinary: Negative for difficulty urinating, discharge, flank pain, genital sores, hematuria and urgency.  Musculoskeletal: Negative for arthralgias, back pain, gait problem, joint swelling, myalgias and neck stiffness.  Skin: Negative for rash.  Neurological: Negative for dizziness, syncope, speech difficulty, weakness, numbness and headaches.  Hematological: Negative for adenopathy. Does not bruise/bleed easily.  Psychiatric/Behavioral: Negative for behavioral problems and dysphoric mood. The patient is not nervous/anxious.        Objective:   Physical Exam  Constitutional: He appears well-developed and well-nourished. No distress.  Blood pressure 112/68 Weight 246          Assessment & Plan:    Diabetes mellitus.  Will continue present regimen.  Nonpharmacologic aspects stressed Patient has a annual exam scheduled in 2 months.  Will reassess at that time with hemoglobin A1c Essential hypertension, stable  Rogelia BogaKWIATKOWSKI,Tashanna Dolin FRANK

## 2016-11-05 ENCOUNTER — Encounter: Payer: Self-pay | Admitting: Internal Medicine

## 2016-11-05 ENCOUNTER — Ambulatory Visit (INDEPENDENT_AMBULATORY_CARE_PROVIDER_SITE_OTHER): Payer: Medicare Other | Admitting: Internal Medicine

## 2016-11-05 VITALS — BP 128/76 | HR 86 | Temp 98.5°F | Ht 69.0 in | Wt 240.0 lb

## 2016-11-05 DIAGNOSIS — Z0000 Encounter for general adult medical examination without abnormal findings: Secondary | ICD-10-CM

## 2016-11-05 DIAGNOSIS — M150 Primary generalized (osteo)arthritis: Secondary | ICD-10-CM

## 2016-11-05 DIAGNOSIS — E1142 Type 2 diabetes mellitus with diabetic polyneuropathy: Secondary | ICD-10-CM | POA: Diagnosis not present

## 2016-11-05 DIAGNOSIS — E785 Hyperlipidemia, unspecified: Secondary | ICD-10-CM

## 2016-11-05 DIAGNOSIS — M159 Polyosteoarthritis, unspecified: Secondary | ICD-10-CM

## 2016-11-05 DIAGNOSIS — I10 Essential (primary) hypertension: Secondary | ICD-10-CM

## 2016-11-05 LAB — POCT GLYCOSYLATED HEMOGLOBIN (HGB A1C): HEMOGLOBIN A1C: 6.3

## 2016-11-05 NOTE — Progress Notes (Signed)
Subjective:    Patient ID: Jerry Beasley, male    DOB: 12/09/1945, 71 y.o.   MRN: [id number]  HPI  Lab Results  Component Value Date   HGBA1C 9.2 (H) 07/30/2016   71 year old patient who is seen today for a preventive health examination and subsequent Medicare wellness visit. He has a recent diagnosis of type 2 diabetes.  He now is on metformin therapy and has improved lifestyle.  His weight is down 10 pounds over the past 3 months Hemoglobin A1c improved today to 6.3.  He generally feels well.  He has been on Cymbalta for diabetic neuropathy.  His pain is much improved but still has some numbness He has essential hypertension and dyslipidemia.  Last colonoscopy February 2007 No recent eye examination  He has a history of osteoarthritis and is status post bilateral total knee replacements  Past Medical History:  Diagnosis Date  . BENIGN PROSTATIC HYPERTROPHY, MILD, HX OF 10/21/2006  . HYPERLIPIDEMIA 10/21/2006  . HYPERTENSION 10/21/2006  . NEPHROLITHIASIS, HX OF 08/30/2007  . 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 Marital status: Married    Spouse name: N/A  . Number of children: N/A  . Years of education: N/A   Occupational History  . Not on file.   Social History Main Topics  . Smoking status: Never Smoker  . Smokeless tobacco: Never Used  . Alcohol use Yes     Comment: rarely  . Drug use: No  . Sexual activity: Not on file   Other Topics Concern  . Not on file   Social History Narrative  . No narrative on file    Past Surgical History:  Procedure Laterality Date  . KNEE SURGERY    . TONSILLECTOMY    . TOTAL KNEE ARTHROPLASTY      Family History  Problem Relation Age of Onset  . Hypertension Neg Hx        family hx    No Known Allergies  Current Outpatient Prescriptions on File Prior to Visit  Medication Sig Dispense Refill  . amLODipine (NORVASC) 10 MG tablet Take 1 tablet (10 mg total) by mouth daily. 90 tablet 0    . aspirin 81 MG tablet Take 81 mg by mouth daily.    . benazepril (LOTENSIN) 40 MG tablet Take 1 tablet (40 mg total) by mouth daily. 90 tablet 1  . DULoxetine (CYMBALTA) 30 MG capsule Take 1 capsule (30 mg total) by mouth daily. 90 capsule 2  . glucose blood test strip Check blood sugar TID and prn. Dx E11.9 100 each 12  . metFORMIN (GLUCOPHAGE XR) 500 MG 24 hr tablet 2 tablets twice daily 180 tablet 1  . Multiple Vitamin (MULTI-VITAMIN DAILY PO) Take 1 tablet by mouth daily.    . simvastatin (ZOCOR) 40 MG tablet TAKE ONE TABLET BY MOUTH ONCE DAILY 30 tablet 1  . traMADol (ULTRAM) 50 MG tablet Take 1 tablet (50 mg total) by mouth every 6 (six) hours as needed. 60 tablet 0   No current facility-administered medications on file prior to visit.     BP 128/76 (BP Location: Left Arm, Patient Position: Sitting, Cuff Size: Normal)   Pulse 86   Temp 98.5 F (36.9 C) (Oral)   Ht 5\' 9"  (1.753 m)   Wt 240 lb (108.9 kg)   SpO2 96%   BMI 35.44 kg/m       Review of Systems  Constitutional: Negative for appetite change,  chills, fatigue and fever.  HENT: Negative for congestion, dental problem, ear pain, hearing loss, sore throat, tinnitus, trouble swallowing and voice change.   Eyes: Negative for pain, discharge and visual disturbance.  Respiratory: Negative for cough, chest tightness, wheezing and stridor.   Cardiovascular: Negative for chest pain, palpitations and leg swelling.  Gastrointestinal: Negative for abdominal distention, abdominal pain, blood in stool, constipation, diarrhea, nausea and vomiting.  Genitourinary: Negative for difficulty urinating, discharge, flank pain, genital sores, hematuria and urgency.  Musculoskeletal: Negative for arthralgias, back pain, gait problem, joint swelling, myalgias and neck stiffness.  Skin: Negative for rash.  Neurological: Positive for numbness. Negative for dizziness, syncope, speech difficulty, weakness and headaches.  Hematological: Negative  for adenopathy. Does not bruise/bleed easily.  Psychiatric/Behavioral: Negative for behavioral problems and dysphoric mood. The patient is not nervous/anxious.        Objective:   Physical Exam  Constitutional: He appears well-developed and well-nourished.  Weight 240 Blood pressure 120/78  HENT:  Head: Normocephalic and atraumatic.  Right Ear: External ear normal.  Left Ear: External ear normal.  Nose: Nose normal.  Mouth/Throat: Oropharynx is clear and moist.  Eyes: Pupils are equal, round, and reactive to light. Conjunctivae and EOM are normal. No scleral icterus.  Neck: Normal range of motion. Neck supple. No JVD present. No thyromegaly present.  Cardiovascular: Regular rhythm, normal heart sounds and intact distal pulses.  Exam reveals no gallop and no friction rub.   No murmur heard. Pulmonary/Chest: Effort normal and breath sounds normal. He exhibits no tenderness.  Abdominal: Soft. Bowel sounds are normal. He exhibits no distension and no mass. There is no tenderness.  Genitourinary: Prostate normal and penis normal. Rectal exam shows guaiac negative stool.  Musculoskeletal: Normal range of motion. He exhibits no edema or tenderness.  Status post bilateral total knee replacement surgeries  Lymphadenopathy:    He has no cervical adenopathy.  Neurological: He is alert. He has normal reflexes. No cranial nerve deficit. Coordination normal.  Skin: Skin is warm and dry. No rash noted.  Psychiatric: He has a normal mood and affect. His behavior is normal.          Assessment & Plan:   Subsequent Medicare wellness visit Essential hypertension, well-controlled Diabetes mellitus.  In going A1c 6.3.  Continue nonpharmacologic efforts and metformin Obesity.  Additional weight loss encouraged Diabetic peripheral neuropathy.  Patient's pain has greatly improved on Cymbalta.  Still has chronic numbness.  No change in present regimen Dyslipidemia.  Continue statin  therapy  Follow-up 3 months  Deshanta Lady Homero FellersFRANK

## 2016-11-05 NOTE — Patient Instructions (Signed)
Limit your sodium (Salt) intake  Please check your blood pressure on a regular basis.  If it is consistently greater than 150/90, please make an office appointment.  Schedule your colonoscopy to help detect colon cancer.  Please see your eye doctor yearly to check for diabetic eye damage  You need to lose weight.  Consider a lower calorie diet and regular exercise.

## 2016-11-13 ENCOUNTER — Other Ambulatory Visit: Payer: Self-pay

## 2016-11-13 MED ORDER — SIMVASTATIN 40 MG PO TABS: 40.0000 mg | ORAL_TABLET | Freq: Every day | ORAL | 1 refills | Status: DC

## 2016-11-13 MED ORDER — AMLODIPINE BESYLATE 10 MG PO TABS: 10.0000 mg | ORAL_TABLET | Freq: Every day | ORAL | 0 refills | Status: DC

## 2017-01-01 ENCOUNTER — Encounter: Payer: Self-pay | Admitting: Internal Medicine

## 2017-01-08 ENCOUNTER — Encounter: Payer: Self-pay | Admitting: Internal Medicine

## 2017-01-21 ENCOUNTER — Other Ambulatory Visit: Payer: Self-pay | Admitting: Internal Medicine

## 2017-02-09 ENCOUNTER — Ambulatory Visit (INDEPENDENT_AMBULATORY_CARE_PROVIDER_SITE_OTHER): Payer: Medicare Other | Admitting: Internal Medicine

## 2017-02-09 ENCOUNTER — Encounter: Payer: Self-pay | Admitting: Internal Medicine

## 2017-02-09 VITALS — BP 144/78 | HR 89 | Temp 98.0°F | Ht 69.0 in | Wt 241.8 lb

## 2017-02-09 DIAGNOSIS — Z23 Encounter for immunization: Secondary | ICD-10-CM

## 2017-02-09 DIAGNOSIS — E785 Hyperlipidemia, unspecified: Secondary | ICD-10-CM

## 2017-02-09 DIAGNOSIS — Z0000 Encounter for general adult medical examination without abnormal findings: Secondary | ICD-10-CM | POA: Diagnosis not present

## 2017-02-09 DIAGNOSIS — I10 Essential (primary) hypertension: Secondary | ICD-10-CM | POA: Diagnosis not present

## 2017-02-09 DIAGNOSIS — E1142 Type 2 diabetes mellitus with diabetic polyneuropathy: Secondary | ICD-10-CM

## 2017-02-09 LAB — HEMOGLOBIN A1C: Hgb A1c MFr Bld: 6.9 % — ABNORMAL HIGH (ref 4.6–6.5)

## 2017-02-09 NOTE — Patient Instructions (Addendum)
Limit your sodium (Salt) intake    It is important that you exercise regularly, at least 20 minutes 3 to 4 times per week.  If you develop chest pain or shortness of breath seek  medical attention.  You need to lose weight.  Consider a lower calorie diet and regular exercise.  Schedule your colonoscopy to help detect colon cancer.  Please see your eye doctor yearly to check for diabetic eye damage   Back Exercises The following exercises strengthen the muscles that help to support the back. They also help to keep the lower back flexible. Doing these exercises can help to prevent back pain or lessen existing pain. If you have back pain or discomfort, try doing these exercises 2-3 times each day or as told by your health care provider. When the pain goes away, do them once each day, but increase the number of times that you repeat the steps for each exercise (do more repetitions). If you do not have back pain or discomfort, do these exercises once each day or as told by your health care provider. Exercises Single Knee to Chest  Repeat these steps 3-5 times for each leg: 1. Lie on your back on a firm bed or the floor with your legs extended. 2. Bring one knee to your chest. Your other leg should stay extended and in contact with the floor. 3. Hold your knee in place by grabbing your knee or thigh. 4. Pull on your knee until you feel a gentle stretch in your lower back. 5. Hold the stretch for 10-30 seconds. 6. Slowly release and straighten your leg.  Pelvic Tilt  Repeat these steps 5-10 times: 1. Lie on your back on a firm bed or the floor with your legs extended. 2. Bend your knees so they are pointing toward the ceiling and your feet are flat on the floor. 3. Tighten your lower abdominal muscles to press your lower back against the floor. This motion will tilt your pelvis so your tailbone points up toward the ceiling instead of pointing to your feet or the floor. 4. With gentle tension  and even breathing, hold this position for 5-10 seconds.  Cat-Cow  Repeat these steps until your lower back becomes more flexible: 1. Get into a hands-and-knees position on a firm surface. Keep your hands under your shoulders, and keep your knees under your hips. You may place padding under your knees for comfort. 2. Let your head hang down, and point your tailbone toward the floor so your lower back becomes rounded like the back of a cat. 3. Hold this position for 5 seconds. 4. Slowly lift your head and point your tailbone up toward the ceiling so your back forms a sagging arch like the back of a cow. 5. Hold this position for 5 seconds.  Press-Ups  Repeat these steps 5-10 times: 1. Lie on your abdomen (face-down) on the floor. 2. Place your palms near your head, about shoulder-width apart. 3. While you keep your back as relaxed as possible and keep your hips on the floor, slowly straighten your arms to raise the top half of your body and lift your shoulders. Do not use your back muscles to raise your upper torso. You may adjust the placement of your hands to make yourself more comfortable. 4. Hold this position for 5 seconds while you keep your back relaxed. 5. Slowly return to lying flat on the floor.  Bridges  Repeat these steps 10 times: 1. Lie on your  back on a firm surface. 2. Bend your knees so they are pointing toward the ceiling and your feet are flat on the floor. 3. Tighten your buttocks muscles and lift your buttocks off of the floor until your waist is at almost the same height as your knees. You should feel the muscles working in your buttocks and the back of your thighs. If you do not feel these muscles, slide your feet 1-2 inches farther away from your buttocks. 4. Hold this position for 3-5 seconds. 5. Slowly lower your hips to the starting position, and allow your buttocks muscles to relax completely.  If this exercise is too easy, try doing it with your arms crossed  over your chest. Abdominal Crunches  Repeat these steps 5-10 times: 1. Lie on your back on a firm bed or the floor with your legs extended. 2. Bend your knees so they are pointing toward the ceiling and your feet are flat on the floor. 3. Cross your arms over your chest. 4. Tip your chin slightly toward your chest without bending your neck. 5. Tighten your abdominal muscles and slowly raise your trunk (torso) high enough to lift your shoulder blades a tiny bit off of the floor. Avoid raising your torso higher than that, because it can put too much stress on your low back and it does not help to strengthen your abdominal muscles. 6. Slowly return to your starting position.  Back Lifts Repeat these steps 5-10 times: 1. Lie on your abdomen (face-down) with your arms at your sides, and rest your forehead on the floor. 2. Tighten the muscles in your legs and your buttocks. 3. Slowly lift your chest off of the floor while you keep your hips pressed to the floor. Keep the back of your head in line with the curve in your back. Your eyes should be looking at the floor. 4. Hold this position for 3-5 seconds. 5. Slowly return to your starting position.  Contact a health care provider if:  Your back pain or discomfort gets much worse when you do an exercise.  Your back pain or discomfort does not lessen within 2 hours after you exercise. If you have any of these problems, stop doing these exercises right away. Do not do them again unless your health care provider says that you can. Get help right away if:  You develop sudden, severe back pain. If this happens, stop doing the exercises right away. Do not do them again unless your health care provider says that you can. This information is not intended to replace advice given to you by your health care provider. Make sure you discuss any questions you have with your health care provider. Document Released: 05/08/2004 Document Revised: 08/08/2015  Document Reviewed: 05/25/2014 Elsevier Interactive Patient Education  2017 [employer].

## 2017-02-09 NOTE — Progress Notes (Signed)
Subjective:    Patient ID: Jerry Beasley, male    DOB: 03-May-1945, 71 y.o.   MRN: [id number]  HPI  71 year old patient who is seen today for follow-up of type 2 diabetes. Weight has increased slightly.  He states home blood sugars have trended up a bit too.  He is on metformin therapy only. Doing well. No recent eye examination Overdue for colonoscopy ( 2007)  Past Medical History:  Diagnosis Date  . BENIGN PROSTATIC HYPERTROPHY, MILD, HX OF 10/21/2006  . HYPERLIPIDEMIA 10/21/2006  . HYPERTENSION 10/21/2006  . NEPHROLITHIASIS, HX OF 08/30/2007  . 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 Marital status: Married    Spouse name: N/A  . Number of children: N/A  . Years of education: N/A   Occupational History  . Not on file.   Social History Main Topics  . Smoking status: Never Smoker  . Smokeless tobacco: Never Used  . Alcohol use Yes     Comment: rarely  . Drug use: No  . Sexual activity: Not on file   Other Topics Concern  . Not on file   Social History Narrative  . No narrative on file    Past Surgical History:  Procedure Laterality Date  . KNEE SURGERY    . TONSILLECTOMY    . TOTAL KNEE ARTHROPLASTY      Family History  Problem Relation Age of Onset  . Hypertension Neg Hx        family hx    No Known Allergies  Current Outpatient Prescriptions on File Prior to Visit  Medication Sig Dispense Refill  . amLODipine (NORVASC) 10 MG tablet Take 1 tablet (10 mg total) by mouth daily. 90 tablet 0  . aspirin 81 MG tablet Take 81 mg by mouth daily.    . benazepril (LOTENSIN) 40 MG tablet Take 1 tablet (40 mg total) by mouth daily. 90 tablet 1  . DULoxetine (CYMBALTA) 30 MG capsule Take 1 capsule (30 mg total) by mouth daily. 90 capsule 2  . glucose blood test strip Check blood sugar TID and prn. Dx E11.9 100 each 12  . metFORMIN (GLUCOPHAGE-XR) 500 MG 24 hr tablet TAKE 2 TABLETS BY MOUTH TWICE DAILY 180 tablet 1  . Multiple Vitamin  (MULTI-VITAMIN DAILY PO) Take 1 tablet by mouth daily.    . simvastatin (ZOCOR) 40 MG tablet Take 1 tablet (40 mg total) by mouth daily. 90 tablet 1  . traMADol (ULTRAM) 50 MG tablet Take 1 tablet (50 mg total) by mouth every 6 (six) hours as needed. 60 tablet 0   No current facility-administered medications on file prior to visit.     BP (!) 144/78 (BP Location: Left Arm, Patient Position: Sitting, Cuff Size: Normal)   Pulse 89   Temp 98 F (36.7 C) (Oral)   Ht 5\' 9"  (1.753 m)   Wt 241 lb 12.8 oz (109.7 kg)   SpO2 96%   BMI 35.71 kg/m     Review of Systems  Constitutional: Negative for appetite change, chills, fatigue and fever.  HENT: Negative for congestion, dental problem, ear pain, hearing loss, sore throat, tinnitus, trouble swallowing and voice change.   Eyes: Negative for pain, discharge and visual disturbance.  Respiratory: Negative for cough, chest tightness, wheezing and stridor.   Cardiovascular: Negative for chest pain, palpitations and leg swelling.  Gastrointestinal: Negative for abdominal distention, abdominal pain, blood in stool, constipation, diarrhea, nausea and vomiting.  Genitourinary: Negative  for difficulty urinating, discharge, flank pain, genital sores, hematuria and urgency.  Musculoskeletal: Negative for arthralgias, back pain, gait problem, joint swelling, myalgias and neck stiffness.  Skin: Negative for rash.  Neurological: Negative for dizziness, syncope, speech difficulty, weakness, numbness and headaches.  Hematological: Negative for adenopathy. Does not bruise/bleed easily.  Psychiatric/Behavioral: Negative for behavioral problems and dysphoric mood. The patient is not nervous/anxious.        Objective:   Physical Exam  Constitutional: He is oriented to person, place, and time. He appears well-developed.  HENT:  Head: Normocephalic.  Right Ear: External ear normal.  Left Ear: External ear normal.  Eyes: Conjunctivae and EOM are normal.    Neck: Normal range of motion.  Cardiovascular: Normal rate and normal heart sounds.   Pulmonary/Chest: Breath sounds normal.  Abdominal: Bowel sounds are normal.  Musculoskeletal: Normal range of motion. He exhibits no edema or tenderness.  Neurological: He is alert and oriented to person, place, and time.  Psychiatric: He has a normal mood and affect. His behavior is normal.          Assessment & Plan:   Diabetes mellitus.  Will check hemoglobin A1c.  Weight loss encouraged Dyslipidemia Essential hypertension Health maintenance.  Needs follow-up colonoscopy  I examination encouraged.  Patient states he wishes to set this up on his own  Follow-up 3 months  Chasey Dull Homero FellersFRANK

## 2017-02-26 ENCOUNTER — Ambulatory Visit (INDEPENDENT_AMBULATORY_CARE_PROVIDER_SITE_OTHER): Payer: Medicare Other | Admitting: Internal Medicine

## 2017-02-26 ENCOUNTER — Encounter: Payer: Self-pay | Admitting: Internal Medicine

## 2017-02-26 VITALS — BP 166/80 | HR 103 | Temp 97.8°F | Ht 69.0 in | Wt 247.0 lb

## 2017-02-26 DIAGNOSIS — I10 Essential (primary) hypertension: Secondary | ICD-10-CM | POA: Diagnosis not present

## 2017-02-26 DIAGNOSIS — M5417 Radiculopathy, lumbosacral region: Secondary | ICD-10-CM

## 2017-02-26 DIAGNOSIS — E1142 Type 2 diabetes mellitus with diabetic polyneuropathy: Secondary | ICD-10-CM

## 2017-02-26 MED ORDER — METHYLPREDNISOLONE ACETATE 80 MG/ML IJ SUSP
80.0000 mg | Freq: Once | INTRAMUSCULAR | Status: AC
Start: 2017-02-26 — End: 2017-02-26
  Administered 2017-02-26: 80 mg via INTRAMUSCULAR

## 2017-02-26 NOTE — Patient Instructions (Addendum)
Sciatica Rehab  Ask your health care provider which exercises are safe for you. Do exercises exactly as told by your health care provider and adjust them as directed. It is normal to feel mild stretching, pulling, tightness, or discomfort as you do these exercises, but you should stop right away if you feel sudden pain or your pain gets worse. Do not begin these exercises until told by your health care provider.  Stretching and range of motion exercises  These exercises warm up your muscles and joints and improve the movement and flexibility of your hips and your back. These exercises also help to relieve pain, numbness, and tingling.  Exercise A: Sciatic nerve glide  1. Sit in a chair with your head facing down toward your chest. Place your hands behind your back. Let your shoulders slump forward.  2. Slowly straighten one of your knees while you tilt your head back as if you are looking toward the ceiling. Only straighten your leg as far as you can without making your symptoms worse.  3. Hold for __________ seconds.  4. Slowly return to the starting position.  5. Repeat with your other leg.  Repeat __________ times. Complete this exercise __________ times a day.  Exercise B: Knee to chest with hip adduction and internal rotation    1. Lie on your back on a firm surface with both legs straight.  2. Bend one of your knees and move it up toward your chest until you feel a gentle stretch in your lower back and buttock. Then, move your knee toward the shoulder that is on the opposite side from your leg.  ? Hold your leg in this position by holding onto the front of your knee.  3. Hold for __________ seconds.  4. Slowly return to the starting position.  5. Repeat with your other leg.  Repeat __________ times. Complete this exercise __________ times a day.  Exercise C: Prone extension on elbows    1. Lie on your abdomen on a firm surface. A bed may be too soft for this exercise.  2. Prop yourself up on your  elbows.  3. Use your arms to help lift your chest up until you feel a gentle stretch in your abdomen and your lower back.  ? This will place some of your body weight on your elbows. If this is uncomfortable, try stacking pillows under your chest.  ? Your hips should stay down, against the surface that you are lying on. Keep your hip and back muscles relaxed.  4. Hold for __________ seconds.  5. Slowly relax your upper body and return to the starting position.  Repeat __________ times. Complete this exercise __________ times a day.  Strengthening exercises  These exercises build strength and endurance in your back. Endurance is the ability to use your muscles for a long time, even after they get tired.  Exercise D: Pelvic tilt  1. Lie on your back on a firm surface. Bend your knees and keep your feet flat.  2. Tense your abdominal muscles. Tip your pelvis up toward the ceiling and flatten your lower back into the floor.  ? To help with this exercise, you may place a small towel under your lower back and try to push your back into the towel.  3. Hold for __________ seconds.  4. Let your muscles relax completely before you repeat this exercise.  Repeat __________ times. Complete this exercise __________ times a day.  Exercise E: Alternating arm and leg raises      1. Get on your hands and knees on a firm surface. If you are on a hard floor, you may want to use padding to cushion your knees, such as an exercise mat.  2. Line up your arms and legs. Your hands should be below your shoulders, and your knees should be below your hips.  3. Lift your left leg behind you. At the same time, raise your right arm and straighten it in front of you.  ? Do not lift your leg higher than your hip.  ? Do not lift your arm higher than your shoulder.  ? Keep your abdominal and back muscles tight.  ? Keep your hips facing the ground.  ? Do not arch your back.  ? Keep your balance carefully, and do not hold your breath.  4. Hold for  __________ seconds.  5. Slowly return to the starting position and repeat with your right leg and your left arm.  Repeat __________ times. Complete this exercise __________ times a day.  Posture and body mechanics    Body mechanics refers to the movements and positions of your body while you do your daily activities. Posture is part of body mechanics. Good posture and healthy body mechanics can help to relieve stress in your body's tissues and joints. Good posture means that your spine is in its natural S-curve position (your spine is neutral), your shoulders are pulled back slightly, and your head is not tipped forward. The following are general guidelines for applying improved posture and body mechanics to your everyday activities.  Standing    · When standing, keep your spine neutral and your feet about hip-width apart. Keep a slight bend in your knees. Your ears, shoulders, and hips should line up.  · When you do a task in which you stand in one place for a long time, place one foot up on a stable object that is 2-4 inches (5-10 cm) high, such as a footstool. This helps keep your spine neutral.  Sitting    · When sitting, keep your spine neutral and keep your feet flat on the floor. Use a footrest, if necessary, and keep your thighs parallel to the floor. Avoid rounding your shoulders, and avoid tilting your head forward.  · When working at a desk or a computer, keep your desk at a height where your hands are slightly lower than your elbows. Slide your chair under your desk so you are close enough to maintain good posture.  · When working at a computer, place your monitor at a height where you are looking straight ahead and you do not have to tilt your head forward or downward to look at the screen.  Resting    · When lying down and resting, avoid positions that are most painful for you.  · If you have pain with activities such as sitting, bending, stooping, or squatting (flexion-based activities), lie in a  position in which your body does not bend very much. For example, avoid curling up on your side with your arms and knees near your chest (fetal position).  · If you have pain with activities such as standing for a long time or reaching with your arms (extension-based activities), lie with your spine in a neutral position and bend your knees slightly. Try the following positions:  ? Lying on your side with a pillow between your knees.  ? Lying on your back with a pillow under your knees.  Lifting    · When lifting   objects, keep your feet at least shoulder-width apart and tighten your abdominal muscles.  · Bend your knees and hips and keep your spine neutral. It is important to lift using the strength of your legs, not your back. Do not lock your knees straight out.  · Always ask for help to lift heavy or awkward objects.  This information is not intended to replace advice given to you by your health care provider. Make sure you discuss any questions you have with your health care provider.  Document Released: 03/31/2005 Document Revised: 12/06/2015 Document Reviewed: 12/15/2014  Elsevier Interactive Patient Education © 2018 [employer]

## 2017-02-26 NOTE — Progress Notes (Signed)
Subjective:    Patient ID: Jerry Beasley, male    DOB: 10/07/1945, 71 y.o.   MRN: [id number]  HPI  BP Readings from Last 3 Encounters:  02/26/17 (!) 166/80  02/09/17 (!) 144/78  11/05/16 [id number]   71 year old patient who presents with a 5443-month history of right leg pain.  This starts in the right lumbar area and radiates to the right posterior lateral leg to the posterior mid calf area. Pain is bothersome throughout the night occasionally and does interfere with daily routines.  He does have a history of diabetes and is treated for a diabetic peripheral neuropathy.  He has essential hypertension. No motor weakness  Past Medical History:  Diagnosis Date  . BENIGN PROSTATIC HYPERTROPHY, MILD, HX OF 10/21/2006  . HYPERLIPIDEMIA 10/21/2006  . HYPERTENSION 10/21/2006  . NEPHROLITHIASIS, HX OF 08/30/2007  . Osteoarth NOS-Unspec 10/21/2006     Social History   Socioeconomic History  . Marital status: Married    Spouse name: Not on file  . Number of children: Not on file  . Years of education: Not on file  . Highest education level: Not on file  Social Needs  . Financial resource strain: Not on file  . Food insecurity - worry: Not on file  . Food insecurity - inability: Not on file  . Transportation needs - medical: Not on file  . Transportation needs - non-medical: Not on file  Occupational History  . Not on file  Tobacco Use  . Smoking status: Never Smoker  . Smokeless tobacco: Never Used  Substance and Sexual Activity  . Alcohol use: Yes    Comment: rarely  . Drug use: No  . Sexual activity: Not on file  Other Topics Concern  . Not on file  Social History Narrative  . Not on file    Past Surgical History:  Procedure Laterality Date  . KNEE SURGERY    . TONSILLECTOMY    . TOTAL KNEE ARTHROPLASTY      Family History  Problem Relation Age of Onset  . Hypertension Neg Hx        family hx    No Known Allergies  Current Outpatient Medications on File Prior to Visit   Medication Sig Dispense Refill  . amLODipine (NORVASC) 10 MG tablet Take 1 tablet (10 mg total) by mouth daily. 90 tablet 0  . aspirin 81 MG tablet Take 81 mg by mouth daily.    . benazepril (LOTENSIN) 40 MG tablet Take 1 tablet (40 mg total) by mouth daily. 90 tablet 1  . DULoxetine (CYMBALTA) 30 MG capsule Take 1 capsule (30 mg total) by mouth daily. 90 capsule 2  . glucose blood test strip Check blood sugar TID and prn. Dx E11.9 100 each 12  . metFORMIN (GLUCOPHAGE-XR) 500 MG 24 hr tablet TAKE 2 TABLETS BY MOUTH TWICE DAILY 180 tablet 1  . Multiple Vitamin (MULTI-VITAMIN DAILY PO) Take 1 tablet by mouth daily.    . simvastatin (ZOCOR) 40 MG tablet Take 1 tablet (40 mg total) by mouth daily. 90 tablet 1  . traMADol (ULTRAM) 50 MG tablet Take 1 tablet (50 mg total) by mouth every 6 (six) hours as needed. 60 tablet 0   No current facility-administered medications on file prior to visit.     BP (!) 166/80 (BP Location: Left Arm, Patient Position: Sitting, Cuff Size: Normal)   Pulse (!) 103   Temp 97.8 F (36.6 C) (Oral)   Ht 5\' 9"  (1.753 m)  Wt 247 lb (112 kg)   SpO2 95%   BMI 36.48 kg/m     Review of Systems  Constitutional: Negative for appetite change, chills, fatigue and fever.  HENT: Negative for congestion, dental problem, ear pain, hearing loss, sore throat, tinnitus, trouble swallowing and voice change.   Eyes: Negative for pain, discharge and visual disturbance.  Respiratory: Negative for cough, chest tightness, wheezing and stridor.   Cardiovascular: Negative for chest pain, palpitations and leg swelling.  Gastrointestinal: Negative for abdominal distention, abdominal pain, blood in stool, constipation, diarrhea, nausea and vomiting.  Genitourinary: Negative for difficulty urinating, discharge, flank pain, genital sores, hematuria and urgency.  Musculoskeletal: Positive for back pain. Negative for arthralgias, gait problem, joint swelling, myalgias and neck stiffness.    Skin: Negative for rash.  Neurological: Negative for dizziness, syncope, speech difficulty, weakness, numbness and headaches.  Hematological: Negative for adenopathy. Does not bruise/bleed easily.  Psychiatric/Behavioral: Negative for behavioral problems and dysphoric mood. The patient is not nervous/anxious.        Objective:   Physical Exam  Constitutional: He appears well-developed and well-nourished. No distress.  Cardiovascular: Intact distal pulses.  Musculoskeletal:  Positive straight leg test on the right reproduces right leg pain  No motor weakness Able to walk on toes and heels without difficulty Achilles reflexes blunted but symmetrical          Assessment & Plan:   Probable right L5 radiculopathy.  Will treat with Depo-Medrol 80.  Patient information concerning rehab/stretching exercises dispensed.  He will report any new or worsening symptoms.  Depending on clinical response will consider formal referral for physical therapy or possible lumbar MRI  Jerry Beasley

## 2017-03-12 ENCOUNTER — Encounter: Payer: Self-pay | Admitting: Internal Medicine

## 2017-03-30 ENCOUNTER — Ambulatory Visit: Payer: Medicare Other | Admitting: Internal Medicine

## 2017-05-12 ENCOUNTER — Ambulatory Visit: Payer: Medicare Other | Admitting: Internal Medicine

## 2017-05-26 ENCOUNTER — Encounter: Payer: Self-pay | Admitting: Internal Medicine

## 2017-05-26 ENCOUNTER — Ambulatory Visit (INDEPENDENT_AMBULATORY_CARE_PROVIDER_SITE_OTHER): Payer: Medicare Other | Admitting: Internal Medicine

## 2017-05-26 VITALS — BP 140/78 | HR 104 | Temp 98.1°F | Ht 69.0 in | Wt 248.0 lb

## 2017-05-26 DIAGNOSIS — E1142 Type 2 diabetes mellitus with diabetic polyneuropathy: Secondary | ICD-10-CM

## 2017-05-26 DIAGNOSIS — I10 Essential (primary) hypertension: Secondary | ICD-10-CM | POA: Diagnosis not present

## 2017-05-26 NOTE — Patient Instructions (Signed)
Limit your sodium (Salt) intake   Please check your hemoglobin A1c every 3 months    It is important that you exercise regularly, at least 20 minutes 3 to 4 times per week.  If you develop chest pain or shortness of breath seek  medical attention.  You need to lose weight.  Consider a lower calorie diet and regular exercise. 

## 2017-05-26 NOTE — Progress Notes (Signed)
Subjective:    Patient ID: Jerry Beasley, male    DOB: [birth year], 72 y.o.   MRN: [id number]  HPI  72 year old patient who is seen today for follow-up of type 2 diabetes. Over the past few months there is been some weight gain.  He admits to dietary noncompliance.  Diabetes has been controlled on metformin therapy only He has essential hypertension which has been stable  Lab Results  Component Value Date   HGBA1C 6.9 (H) 02/09/2017    Hemoglobin A1c today 7.2  Past Medical History:  Diagnosis Date  . BENIGN PROSTATIC HYPERTROPHY, MILD, HX OF 10/21/2006  . HYPERLIPIDEMIA 10/21/2006  . HYPERTENSION 10/21/2006  . NEPHROLITHIASIS, HX OF 08/30/2007  . Osteoarth NOS-Unspec 10/21/2006     Social History   Socioeconomic History  . Marital status: Married    Spouse name: Not on file  . Number of children: Not on file  . Years of education: Not on file  . Highest education level: Not on file  Social Needs  . Financial resource strain: Not on file  . Food insecurity - worry: Not on file  . Food insecurity - inability: Not on file  . Transportation needs - medical: Not on file  . Transportation needs - non-medical: Not on file  Occupational History  . Not on file  Tobacco Use  . Smoking status: Never Smoker  . Smokeless tobacco: Never Used  Substance and Sexual Activity  . Alcohol use: Yes    Comment: rarely  . Drug use: No  . Sexual activity: Not on file  Other Topics Concern  . Not on file  Social History Narrative  . Not on file    Past Surgical History:  Procedure Laterality Date  . KNEE SURGERY    . TONSILLECTOMY    . TOTAL KNEE ARTHROPLASTY      Family History  Problem Relation Age of Onset  . Hypertension Neg Hx        family hx    No Known Allergies  Current Outpatient Medications on File Prior to Visit  Medication Sig Dispense Refill  . amLODipine (NORVASC) 10 MG tablet Take 1 tablet (10 mg total) by mouth daily. 90 tablet 0  . aspirin 81 MG tablet  Take 81 mg by mouth daily.    . benazepril (LOTENSIN) 40 MG tablet Take 1 tablet (40 mg total) by mouth daily. 90 tablet 1  . DULoxetine (CYMBALTA) 30 MG capsule Take 1 capsule (30 mg total) by mouth daily. 90 capsule 2  . glucose blood test strip Check blood sugar TID and prn. Dx E11.9 100 each 12  . metFORMIN (GLUCOPHAGE-XR) 500 MG 24 hr tablet TAKE 2 TABLETS BY MOUTH TWICE DAILY 180 tablet 1  . Multiple Vitamin (MULTI-VITAMIN DAILY PO) Take 1 tablet by mouth daily.    . simvastatin (ZOCOR) 40 MG tablet Take 1 tablet (40 mg total) by mouth daily. 90 tablet 1  . traMADol (ULTRAM) 50 MG tablet Take 1 tablet (50 mg total) by mouth every 6 (six) hours as needed. 60 tablet 0   No current facility-administered medications on file prior to visit.     BP 140/78 (BP Location: Right Arm, Patient Position: Sitting, Cuff Size: Large)   Pulse (!) 104   Temp 98.1 F (36.7 C) (Oral)   Ht 5\' 9"  (1.753 m)   Wt 248 lb (112.5 kg)   SpO2 96%   BMI 36.62 kg/m     Review of Systems  Constitutional: Positive  for unexpected weight change. Negative for appetite change, chills, fatigue and fever.  HENT: Negative for congestion, dental problem, ear pain, hearing loss, sore throat, tinnitus, trouble swallowing and voice change.   Eyes: Negative for pain, discharge and visual disturbance.  Respiratory: Negative for cough, chest tightness, wheezing and stridor.   Cardiovascular: Negative for chest pain, palpitations and leg swelling.  Gastrointestinal: Negative for abdominal distention, abdominal pain, blood in stool, constipation, diarrhea, nausea and vomiting.  Genitourinary: Negative for difficulty urinating, discharge, flank pain, genital sores, hematuria and urgency.  Musculoskeletal: Negative for arthralgias, back pain, gait problem, joint swelling, myalgias and neck stiffness.  Skin: Negative for rash.  Neurological: Negative for dizziness, syncope, speech difficulty, weakness, numbness and headaches.    Hematological: Negative for adenopathy. Does not bruise/bleed easily.  Psychiatric/Behavioral: Negative for behavioral problems and dysphoric mood. The patient is not nervous/anxious.        Objective:   Physical Exam  Constitutional: He is oriented to person, place, and time. He appears well-developed.  HENT:  Head: Normocephalic.  Right Ear: External ear normal.  Left Ear: External ear normal.  Eyes: Conjunctivae and EOM are normal.  Neck: Normal range of motion.  Cardiovascular: Normal rate and normal heart sounds.  Pulmonary/Chest: Breath sounds normal.  Abdominal: Bowel sounds are normal.  Musculoskeletal: Normal range of motion. He exhibits no edema or tenderness.  Neurological: He is alert and oriented to person, place, and time.  Psychiatric: He has a normal mood and affect. His behavior is normal.          Assessment & Plan:   Diabetes mellitus.  Hemoglobin A1c increased to 7.2.  Nonpharmacologic measures discussed.  Patient wishes to try lifestyle changes prior to additional medications.  Will reassess in 3 months Essential hypertension stable  Rogelia BogaKWIATKOWSKI,Ichelle Harral FRANK

## 2017-06-11 ENCOUNTER — Other Ambulatory Visit: Payer: Self-pay | Admitting: Internal Medicine

## 2017-07-08 ENCOUNTER — Other Ambulatory Visit: Payer: Self-pay | Admitting: Internal Medicine

## 2017-08-24 ENCOUNTER — Encounter: Payer: Self-pay | Admitting: Internal Medicine

## 2017-08-24 ENCOUNTER — Ambulatory Visit (INDEPENDENT_AMBULATORY_CARE_PROVIDER_SITE_OTHER): Payer: Medicare Other | Admitting: Internal Medicine

## 2017-08-24 VITALS — BP 140/80 | HR 104 | Temp 98.6°F | Wt 250.0 lb

## 2017-08-24 DIAGNOSIS — M150 Primary generalized (osteo)arthritis: Secondary | ICD-10-CM

## 2017-08-24 DIAGNOSIS — E785 Hyperlipidemia, unspecified: Secondary | ICD-10-CM | POA: Diagnosis not present

## 2017-08-24 DIAGNOSIS — I499 Cardiac arrhythmia, unspecified: Secondary | ICD-10-CM | POA: Diagnosis not present

## 2017-08-24 DIAGNOSIS — E1142 Type 2 diabetes mellitus with diabetic polyneuropathy: Secondary | ICD-10-CM

## 2017-08-24 DIAGNOSIS — I10 Essential (primary) hypertension: Secondary | ICD-10-CM

## 2017-08-24 DIAGNOSIS — M159 Polyosteoarthritis, unspecified: Secondary | ICD-10-CM

## 2017-08-24 LAB — TSH: TSH: 1.05 u[IU]/mL (ref 0.35–4.50)

## 2017-08-24 LAB — CBC WITH DIFFERENTIAL/PLATELET
Basophils Absolute: 0.1 10*3/uL (ref 0.0–0.1)
Basophils Relative: 0.7 % (ref 0.0–3.0)
EOS ABS: 0.2 10*3/uL (ref 0.0–0.7)
Eosinophils Relative: 3 % (ref 0.0–5.0)
HCT: 40.8 % (ref 39.0–52.0)
HEMOGLOBIN: 14 g/dL (ref 13.0–17.0)
LYMPHS ABS: 1.8 10*3/uL (ref 0.7–4.0)
Lymphocytes Relative: 21.2 % (ref 12.0–46.0)
MCHC: 34.3 g/dL (ref 30.0–36.0)
MCV: 89.7 fl (ref 78.0–100.0)
MONO ABS: 0.8 10*3/uL (ref 0.1–1.0)
Monocytes Relative: 10 % (ref 3.0–12.0)
NEUTROS PCT: 65.1 % (ref 43.0–77.0)
Neutro Abs: 5.4 10*3/uL (ref 1.4–7.7)
Platelets: 254 10*3/uL (ref 150.0–400.0)
RBC: 4.55 Mil/uL (ref 4.22–5.81)
RDW: 13.3 % (ref 11.5–15.5)
WBC: 8.3 10*3/uL (ref 4.0–10.5)

## 2017-08-24 LAB — COMPREHENSIVE METABOLIC PANEL
ALBUMIN: 4 g/dL (ref 3.5–5.2)
ALK PHOS: 91 U/L (ref 39–117)
ALT: 20 U/L (ref 0–53)
AST: 15 U/L (ref 0–37)
BUN: 17 mg/dL (ref 6–23)
CO2: 26 mEq/L (ref 19–32)
CREATININE: 0.88 mg/dL (ref 0.40–1.50)
Calcium: 9.1 mg/dL (ref 8.4–10.5)
Chloride: 104 mEq/L (ref 96–112)
GFR: 90.35 mL/min (ref >60.00)
Glucose, Bld: 211 mg/dL — ABNORMAL HIGH (ref 70–99)
Potassium: 4.2 mEq/L (ref 3.5–5.1)
SODIUM: 140 meq/L (ref 135–145)
TOTAL PROTEIN: 6.4 g/dL (ref 6.0–8.3)
Total Bilirubin: 0.5 mg/dL (ref 0.2–1.2)

## 2017-08-24 LAB — LIPID PANEL
Cholesterol: 157 mg/dL (ref 0–200)
HDL: 47.2 mg/dL (ref >39.00)
NonHDL: 109.42
Total CHOL/HDL Ratio: 3
Triglycerides: 209 mg/dL — ABNORMAL HIGH (ref 0.0–149.0)
VLDL: 41.8 mg/dL — ABNORMAL HIGH (ref 0.0–40.0)

## 2017-08-24 LAB — MICROALBUMIN / CREATININE URINE RATIO
CREATININE, U: 157.2 mg/dL
MICROALB UR: 3.9 mg/dL — AB (ref 0.0–1.9)
Microalb Creat Ratio: 2.5 mg/g (ref 0.0–30.0)

## 2017-08-24 LAB — LDL CHOLESTEROL, DIRECT: Direct LDL: 79 mg/dL

## 2017-08-24 LAB — POCT GLYCOSYLATED HEMOGLOBIN (HGB A1C): HEMOGLOBIN A1C: 8

## 2017-08-24 MED ORDER — GLIPIZIDE ER 2.5 MG PO TB24: 2.5000 mg | ORAL_TABLET | Freq: Every day | ORAL | 3 refills | Status: AC

## 2017-08-24 NOTE — Patient Instructions (Signed)
Limit your sodium (Salt) intake  Please check your blood pressure on a regular basis.  If it is consistently greater than 150/90, please make an office appointment.  You need to lose weight.  Consider a lower calorie diet and regular exercise.  Please see your eye doctor yearly to check for diabetic eye damage   Please check your hemoglobin A1c every 3 months

## 2017-08-24 NOTE — Progress Notes (Signed)
Subjective:    Patient ID: Jerry Beasley, male    DOB: [birth year], 72 y.o.   MRN: [id number]  HPI  Lab Results  Component Value Date   HGBA1C 6.9 (H) 02/09/2017   72 year old patient who is seen today for follow-up of type 2 diabetes.  This is complicated by peripheral neuropathy. No recent eye examination.  His last hemoglobin A1c 3 months ago increased to 7.2. Today he complains of increasing fatigue.  He wishes to discontinue metformin believing that he is having side effects with this medication  There is been no weight loss.  He has essential hypertension and history of dyslipidemia.  Past Medical History:  Diagnosis Date  . BENIGN PROSTATIC HYPERTROPHY, MILD, HX OF 10/21/2006  . HYPERLIPIDEMIA 10/21/2006  . HYPERTENSION 10/21/2006  . NEPHROLITHIASIS, HX OF 08/30/2007  . Osteoarth NOS-Unspec 10/21/2006     Social History   Socioeconomic History  . Marital status: Married    Spouse name: Not on file  . Number of children: Not on file  . Years of education: Not on file  . Highest education level: Not on file  Occupational History  . Not on file  Social Needs  . Financial resource strain: Not on file  . Food insecurity:    Worry: Not on file    Inability: Not on file  . Transportation needs:    Medical: Not on file    Non-medical: Not on file  Tobacco Use  . Smoking status: Never Smoker  . Smokeless tobacco: Never Used  Substance and Sexual Activity  . Alcohol use: Yes    Comment: rarely  . Drug use: No  . Sexual activity: Not on file  Lifestyle  . Physical activity:    Days per week: Not on file    Minutes per session: Not on file  . Stress: Not on file  Relationships  . Social connections:    Talks on phone: Not on file    Gets together: Not on file    Attends religious service: Not on file    Active member of club or organization: Not on file    Attends meetings of clubs or organizations: Not on file    Relationship status: Not on file  . Intimate  partner violence:    Fear of current or ex partner: Not on file    Emotionally abused: Not on file    Physically abused: Not on file    Forced sexual activity: Not on file  Other Topics Concern  . Not on file  Social History Narrative  . Not on file    Past Surgical History:  Procedure Laterality Date  . KNEE SURGERY    . TONSILLECTOMY    . TOTAL KNEE ARTHROPLASTY      Family History  Problem Relation Age of Onset  . Hypertension Neg Hx        family hx    No Known Allergies  Current Outpatient Medications on File Prior to Visit  Medication Sig Dispense Refill  . amLODipine (NORVASC) 10 MG tablet Take 1 tablet (10 mg total) by mouth daily. 90 tablet 0  . aspirin 81 MG tablet Take 81 mg by mouth daily.    . benazepril (LOTENSIN) 40 MG tablet Take 1 tablet (40 mg total) by mouth daily. 90 tablet 1  . DULoxetine (CYMBALTA) 30 MG capsule TAKE 1 CAPSULE BY MOUTH ONCE DAILY 90 capsule 2  . glucose blood test strip Check blood sugar TID and prn. Dx  E11.9 100 each 12  . metFORMIN (GLUCOPHAGE-XR) 500 MG 24 hr tablet TAKE 2 TABLETS BY MOUTH TWICE DAILY 180 tablet 1  . Multiple Vitamin (MULTI-VITAMIN DAILY PO) Take 1 tablet by mouth daily.    . simvastatin (ZOCOR) 40 MG tablet Take 1 tablet (40 mg total) by mouth daily. 90 tablet 1  . traMADol (ULTRAM) 50 MG tablet Take 1 tablet (50 mg total) by mouth every 6 (six) hours as needed. 60 tablet 0   No current facility-administered medications on file prior to visit.     BP 140/80 (BP Location: Right Arm, Patient Position: Sitting, Cuff Size: Large)   Pulse (!) 104   Temp 98.6 F (37 C) (Oral)   Wt 250 lb (113.4 kg)   SpO2 96%   BMI 36.92 kg/m     Review of Systems  Constitutional: Positive for fatigue and unexpected weight change. Negative for appetite change, chills and fever.  HENT: Negative for congestion, dental problem, ear pain, hearing loss, sore throat, tinnitus, trouble swallowing and voice change.   Eyes: Negative  for pain, discharge and visual disturbance.  Respiratory: Negative for cough, chest tightness, wheezing and stridor.   Cardiovascular: Negative for chest pain, palpitations and leg swelling.  Gastrointestinal: Negative for abdominal distention, abdominal pain, blood in stool, constipation, diarrhea, nausea and vomiting.  Genitourinary: Negative for difficulty urinating, discharge, flank pain, genital sores, hematuria and urgency.  Musculoskeletal: Negative for arthralgias, back pain, gait problem, joint swelling, myalgias and neck stiffness.  Skin: Negative for rash.  Neurological: Negative for dizziness, syncope, speech difficulty, weakness, numbness and headaches.  Hematological: Negative for adenopathy. Does not bruise/bleed easily.  Psychiatric/Behavioral: Negative for behavioral problems and dysphoric mood. The patient is not nervous/anxious.        Objective:   Physical Exam  Constitutional: He is oriented to person, place, and time. He appears well-developed.  Weight 250 Blood pressure controlled  HENT:  Head: Normocephalic.  Right Ear: External ear normal.  Left Ear: External ear normal.  Eyes: Conjunctivae and EOM are normal.  Neck: Normal range of motion.  Cardiovascular: Normal rate and normal heart sounds.  Frequent ectopics  Pulmonary/Chest: Breath sounds normal.  Abdominal: Bowel sounds are normal.  Musculoskeletal: Normal range of motion. He exhibits no edema or tenderness.  Neurological: He is alert and oriented to person, place, and time.  Psychiatric: He has a normal mood and affect. His behavior is normal.          Assessment & Plan:   Diabetes mellitus.  Will review a hemoglobin A1c.  We will also check a urine for microalbumin Essential hypertension stable Fatigue.  Will check updated lab weight loss encouraged Osteoarthritis Dyslipidemia will review a lipid profile Dysrhythmia.  Will check EKG to rule out atrial fib  Follow-up 3  months  KWIATKOWSKI,PETER FRANK   Addendum.  Hemoglobin A1c 8.0.  Will add glipizide.  Nonpharmacologic measures discussed.  Recheck 3 months  Rogelia Boga

## 2017-08-25 LAB — HEPATITIS C ANTIBODY
HEP C AB: NONREACTIVE
SIGNAL TO CUT-OFF: 0.01 (ref <1.00)

## 2017-10-21 ENCOUNTER — Other Ambulatory Visit: Payer: Self-pay | Admitting: Internal Medicine

## 2017-11-24 ENCOUNTER — Ambulatory Visit (INDEPENDENT_AMBULATORY_CARE_PROVIDER_SITE_OTHER): Payer: Medicare Other | Admitting: Internal Medicine

## 2017-11-24 ENCOUNTER — Encounter: Payer: Self-pay | Admitting: Internal Medicine

## 2017-11-24 VITALS — BP 138/82 | HR 107 | Temp 98.7°F | Wt 249.2 lb

## 2017-11-24 DIAGNOSIS — I10 Essential (primary) hypertension: Secondary | ICD-10-CM

## 2017-11-24 DIAGNOSIS — E1142 Type 2 diabetes mellitus with diabetic polyneuropathy: Secondary | ICD-10-CM

## 2017-11-24 DIAGNOSIS — E785 Hyperlipidemia, unspecified: Secondary | ICD-10-CM | POA: Diagnosis not present

## 2017-11-24 LAB — POCT GLYCOSYLATED HEMOGLOBIN (HGB A1C): HEMOGLOBIN A1C: 6.2 % — AB (ref 4.0–5.6)

## 2017-11-24 NOTE — Progress Notes (Signed)
Subjective:    Patient ID: Jerry Beasley, male    DOB: 10/27/1945, 72 y.o.   MRN: [id number]  HPI  Lab Results  Component Value Date   HGBA1C 6.2 (A) 11/24/2017   72 year old patient who is seen today for follow-up of type 2 diabetes.  Glipizide 2.5 mg was added to his regimen 3 months ago after elevation of hemoglobin A1c to 8.0.  He has noted greatly improved glycemic control and no hypoglycemia.  He has been trying to eat better and clearly exercising more.  Weight has been steady  He remains on statin therapy for dyslipidemia.  Past Medical History:  Diagnosis Date  . BENIGN PROSTATIC HYPERTROPHY, MILD, HX OF 10/21/2006  . HYPERLIPIDEMIA 10/21/2006  . HYPERTENSION 10/21/2006  . NEPHROLITHIASIS, HX OF 08/30/2007  . Osteoarth NOS-Unspec 10/21/2006     Social History   Socioeconomic History  . Marital status: Married    Spouse name: Not on file  . Number of children: Not on file  . Years of education: Not on file  . Highest education level: Not on file  Occupational History  . Not on file  Social Needs  . Financial resource strain: Not on file  . Food insecurity:    Worry: Not on file    Inability: Not on file  . Transportation needs:    Medical: Not on file    Non-medical: Not on file  Tobacco Use  . Smoking status: Never Smoker  . Smokeless tobacco: Never Used  Substance and Sexual Activity  . Alcohol use: Yes    Comment: rarely  . Drug use: No  . Sexual activity: Not on file  Lifestyle  . Physical activity:    Days per week: Not on file    Minutes per session: Not on file  . Stress: Not on file  Relationships  . Social connections:    Talks on phone: Not on file    Gets together: Not on file    Attends religious service: Not on file    Active member of club or organization: Not on file    Attends meetings of clubs or organizations: Not on file    Relationship status: Not on file  . Intimate partner violence:    Fear of current or ex partner: Not on file      Emotionally abused: Not on file    Physically abused: Not on file    Forced sexual activity: Not on file  Other Topics Concern  . Not on file  Social History Narrative  . Not on file    Past Surgical History:  Procedure Laterality Date  . KNEE SURGERY    . TONSILLECTOMY    . TOTAL KNEE ARTHROPLASTY      Family History  Problem Relation Age of Onset  . Hypertension Neg Hx        family hx    No Known Allergies  Current Outpatient Medications on File Prior to Visit  Medication Sig Dispense Refill  . amLODipine (NORVASC) 10 MG tablet TAKE 1 TABLET BY MOUTH ONCE DAILY 90 tablet 0  . aspirin 81 MG tablet Take 81 mg by mouth daily.    . benazepril (LOTENSIN) 40 MG tablet Take 1 tablet (40 mg total) by mouth daily. 90 tablet 1  . DULoxetine (CYMBALTA) 30 MG capsule TAKE 1 CAPSULE BY MOUTH ONCE DAILY 90 capsule 2  . glipiZIDE (GLUCOTROL XL) 2.5 MG 24 hr tablet Take 1 tablet (2.5 mg total) by mouth daily  with breakfast. 90 tablet 3  . glucose blood test strip Check blood sugar TID and prn. Dx E11.9 100 each 12  . metFORMIN (GLUCOPHAGE-XR) 500 MG 24 hr tablet TAKE 2 TABLETS BY MOUTH TWICE DAILY 180 tablet 1  . Multiple Vitamin (MULTI-VITAMIN DAILY PO) Take 1 tablet by mouth daily.    . simvastatin (ZOCOR) 40 MG tablet Take 1 tablet (40 mg total) by mouth daily. 90 tablet 1  . traMADol (ULTRAM) 50 MG tablet Take 1 tablet (50 mg total) by mouth every 6 (six) hours as needed. 60 tablet 0   No current facility-administered medications on file prior to visit.     BP 138/82 (BP Location: Right Arm, Patient Position: Sitting, Cuff Size: Large)   Pulse (!) 107   Temp 98.7 F (37.1 C) (Oral)   Wt 249 lb 3.2 oz (113 kg)   SpO2 96%   BMI 36.80 kg/m     Review of Systems  Constitutional: Negative for appetite change, chills, fatigue and fever.  HENT: Negative for congestion, dental problem, ear pain, hearing loss, sore throat, tinnitus, trouble swallowing and voice change.   Eyes:  Negative for pain, discharge and visual disturbance.  Respiratory: Negative for cough, chest tightness, wheezing and stridor.   Cardiovascular: Negative for chest pain, palpitations and leg swelling.  Gastrointestinal: Negative for abdominal distention, abdominal pain, blood in stool, constipation, diarrhea, nausea and vomiting.  Genitourinary: Negative for difficulty urinating, discharge, flank pain, genital sores, hematuria and urgency.  Musculoskeletal: Negative for arthralgias, back pain, gait problem, joint swelling, myalgias and neck stiffness.  Skin: Negative for rash.  Neurological: Negative for dizziness, syncope, speech difficulty, weakness, numbness and headaches.  Hematological: Negative for adenopathy. Does not bruise/bleed easily.  Psychiatric/Behavioral: Negative for behavioral problems and dysphoric mood. The patient is not nervous/anxious.        Objective:   Physical Exam  Constitutional: He is oriented to person, place, and time. He appears well-developed.  HENT:  Head: Normocephalic.  Right Ear: External ear normal.  Left Ear: External ear normal.  Eyes: Conjunctivae and EOM are normal.  Neck: Normal range of motion.  Cardiovascular: Normal rate and normal heart sounds.  Pulmonary/Chest: Breath sounds normal.  Abdominal: Bowel sounds are normal.  Musculoskeletal: Normal range of motion. He exhibits no edema or tenderness.  Neurological: He is alert and oriented to person, place, and time.  Psychiatric: He has a normal mood and affect. His behavior is normal.          Assessment & Plan:   Diabetes mellitus.  Much improved with hemoglobin A1c down to 6.2.  No history of hypoglycemia.  No change in medical regimen.  Nonpharmacologic measures stressed Essential hypertension stable Exogenous obesity.  Weight loss encouraged Dyslipidemia continue statin therapy  Follow-up with new provider in 4 months  Gordy SaversPeter F Daurice Ovando

## 2017-11-24 NOTE — Patient Instructions (Signed)
Limit your sodium (Salt) intake   Please check your hemoglobin A1c every 3-6  months  Please check your blood pressure on a regular basis.  If it is consistently greater than 150/90, please make an office appointment.  You need to lose weight.  Consider a lower calorie diet and regular exercise.  Return in 4 months for follow-up

## 2017-12-31 ENCOUNTER — Other Ambulatory Visit: Payer: Self-pay | Admitting: Internal Medicine

## 2018-02-01 DIAGNOSIS — Z23 Encounter for immunization: Secondary | ICD-10-CM | POA: Diagnosis not present

## 2018-03-13 ENCOUNTER — Other Ambulatory Visit: Payer: Self-pay | Admitting: Internal Medicine

## 2018-03-30 ENCOUNTER — Other Ambulatory Visit: Payer: Self-pay | Admitting: Internal Medicine

## 2018-03-31 ENCOUNTER — Telehealth: Payer: Self-pay | Admitting: Internal Medicine

## 2018-03-31 NOTE — Telephone Encounter (Signed)
Patient needs refill for Simvastatin and Amlodipine.  He is completely out of medications.    [facility] [place]

## 2018-03-31 NOTE — Telephone Encounter (Signed)
Patient needs a refill on Simvastatin and Amlodipine.  Patient is completely out of medication.   [facility] [place]

## 2018-04-20 ENCOUNTER — Ambulatory Visit (INDEPENDENT_AMBULATORY_CARE_PROVIDER_SITE_OTHER): Payer: Medicare Other | Admitting: Internal Medicine

## 2018-04-20 ENCOUNTER — Encounter: Payer: Self-pay | Admitting: Internal Medicine

## 2018-04-20 VITALS — BP 140/98 | HR 93 | Temp 98.6°F | Ht 69.0 in | Wt 236.7 lb

## 2018-04-20 DIAGNOSIS — I10 Essential (primary) hypertension: Secondary | ICD-10-CM | POA: Diagnosis not present

## 2018-04-20 DIAGNOSIS — E1142 Type 2 diabetes mellitus with diabetic polyneuropathy: Secondary | ICD-10-CM

## 2018-04-20 DIAGNOSIS — G629 Polyneuropathy, unspecified: Secondary | ICD-10-CM | POA: Diagnosis not present

## 2018-04-20 DIAGNOSIS — E785 Hyperlipidemia, unspecified: Secondary | ICD-10-CM | POA: Diagnosis not present

## 2018-04-20 LAB — POCT GLYCOSYLATED HEMOGLOBIN (HGB A1C): Hemoglobin A1C: 5.8 % — AB (ref 4.0–5.6)

## 2018-04-20 MED ORDER — SIMVASTATIN 40 MG PO TABS: 40.0000 mg | ORAL_TABLET | Freq: Every day | ORAL | 1 refills | Status: AC

## 2018-04-20 MED ORDER — AMLODIPINE BESYLATE 10 MG PO TABS: 10.0000 mg | ORAL_TABLET | Freq: Every day | ORAL | 1 refills | Status: AC

## 2018-04-20 MED ORDER — GABAPENTIN 100 MG PO CAPS: 100.0000 mg | ORAL_CAPSULE | Freq: Every day | ORAL | 3 refills | Status: AC

## 2018-04-20 MED ORDER — BENAZEPRIL HCL 40 MG PO TABS
40.0000 mg | ORAL_TABLET | Freq: Every day | ORAL | 1 refills | Status: AC
Start: 2018-04-20 — End: ?

## 2018-04-20 MED ORDER — METFORMIN HCL ER 500 MG PO TB24: 1000.0000 mg | ORAL_TABLET | Freq: Every day | ORAL | 1 refills | Status: AC

## 2018-04-20 NOTE — Patient Instructions (Signed)
-  It was nice see you today!  -Stop taking Cymbalta.  -Start taking Neurontin 100 mg at bedtime for peripheral neuropathy.  Please let us know if you experience increased sedation or if you feel like the medication is not helping enough.  -Please make sure you schedule your annual diabetic eye exam.  -Schedule follow-up with [place] in May for your annual physical exam.  Please come in fasting to that visit.

## 2018-04-20 NOTE — Progress Notes (Signed)
Established Patient Office Visit     CC/Reason for Visit: Establish care, follow-up on chronic medical conditions  HPI: Jerry Beasley is a 73 y.o. male who is coming in today for the above mentioned reasons. Past Medical History is significant for: Hypertension that has been fairly well controlled on lisinopril and amlodipine long-term, type 2 diabetes with most recent A1c of 6.2 on glipizide and metformin.  He is supposed to be taking metformin 2000 mg daily, but he cut dose in half to 1000 mg daily because it made him queasy.  He also has a history of hyperlipidemia that has been fairly well controlled on simvastatin.  Has peripheral neuropathy and has been on Cymbalta without much relief.  He does not have a mood disorder that has been documented that he is aware of.  He has not had an eye exam in years, he is interested today in the Cologuard for colon cancer screening.  Has no acute complaints at today's visit.   Past Medical/Surgical History: Past Medical History:  Diagnosis Date  . BENIGN PROSTATIC HYPERTROPHY, MILD, HX OF 10/21/2006  . HYPERLIPIDEMIA 10/21/2006  . HYPERTENSION 10/21/2006  . NEPHROLITHIASIS, HX OF 08/30/2007  . 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 KNEE SURGERY    . TONSILLECTOMY    . TOTAL KNEE ARTHROPLASTY      Social History:  reports that he has never smoked. He has never used smokeless tobacco. He reports current alcohol use. He reports that he does not use drugs.  Allergies: No Known Allergies  Family History:  Family History  Problem Relation Age of Onset  . Hypertension Neg Hx        family hx     Current Outpatient Medications:  .  amLODipine (NORVASC) 10 MG tablet, Take 1 tablet (10 mg total) by mouth daily., Disp: 90 tablet, Rfl: 1 .  aspirin 81 MG tablet, Take 81 mg by mouth daily., Disp: , Rfl:  .  benazepril (LOTENSIN) 40 MG tablet, Take 1 tablet (40 mg total) by mouth daily., Disp: 90  tablet, Rfl: 1 .  glipiZIDE (GLUCOTROL XL) 2.5 MG 24 hr tablet, Take 1 tablet (2.5 mg total) by mouth daily with breakfast., Disp: 90 tablet, Rfl: 3 .  glucose blood test strip, Check blood sugar TID and prn. Dx E11.9, Disp: 100 each, Rfl: 12 .  metFORMIN (GLUCOPHAGE-XR) 500 MG 24 hr tablet, Take 2 tablets (1,000 mg total) by mouth daily., Disp: 180 tablet, Rfl: 1 .  Multiple Vitamin (MULTI-VITAMIN DAILY PO), Take 1 tablet by mouth daily., Disp: , Rfl:  .  simvastatin (ZOCOR) 40 MG tablet, Take 1 tablet (40 mg total) by mouth daily., Disp: 90 tablet, Rfl: 1 .  gabapentin (NEURONTIN) 100 MG capsule, Take 1 capsule (100 mg total) by mouth at bedtime., Disp: 30 capsule, Rfl: 3  Review of Systems:  Constitutional: Denies fever, chills, diaphoresis, appetite change and fatigue.  HEENT: Denies photophobia, eye pain, redness, hearing loss, ear pain, congestion, sore throat, rhinorrhea, sneezing, mouth sores, trouble swallowing, neck pain, neck stiffness and tinnitus.   Respiratory: Denies SOB, DOE, cough, chest tightness,  and wheezing.   Cardiovascular: Denies chest pain, palpitations and leg swelling.  Gastrointestinal: Denies nausea, vomiting, abdominal pain, diarrhea, constipation, blood in stool and abdominal distention.  Genitourinary: Denies dysuria, urgency, frequency, hematuria, flank pain and difficulty urinating.  Endocrine: Denies: hot or cold intolerance, sweats, changes in hair or nails, polyuria, polydipsia.  Musculoskeletal: Denies myalgias, back pain, joint swelling, arthralgias and gait problem.  Skin: Denies pallor, rash and wound.  Neurological: Denies dizziness, seizures, syncope, weakness, light-headedness, numbness and headaches.  Hematological: Denies adenopathy. Easy bruising, personal or family bleeding history  Psychiatric/Behavioral: Denies suicidal ideation, mood changes, confusion, nervousness, sleep disturbance and agitation    Physical Exam: Vitals:   04/20/18 0803    BP: (!) 140/98  Pulse: 93  Temp: 98.6 F (37 C)  TempSrc: Oral  SpO2: 97%  Weight: 236 lb 11.2 oz (107.4 kg)  Height: 5\' 9"  (1.753 m)    Body mass index is 34.95 kg/m.   Constitutional: NAD, calm, comfortable Eyes: PERRL, lids and conjunctivae normal ENMT: Mucous membranes are moist. Posterior pharynx clear of any exudate or lesions. Normal dentition. Neck: normal, supple, no masses, no thyromegaly Respiratory: clear to auscultation bilaterally, no wheezing, no crackles. Normal respiratory effort. No accessory muscle use.  Cardiovascular: Regular rate and rhythm, no murmurs / rubs / gallops. No extremity edema. 2+ pedal pulses. No carotid bruits.  Abdomen: no tenderness, no masses palpated. No hepatosplenomegaly. Bowel sounds positive.  Musculoskeletal: no clubbing / cyanosis. No joint deformity upper and lower extremities. Good ROM, no contractures. Normal muscle tone.  Skin: no rashes, lesions, ulcers. No induration Neurologic: CN 2-12 grossly intact. Sensation intact, DTR normal. Strength 5/5 in all 4.  Psychiatric: Normal judgment and insight. Alert and oriented x 3. Normal mood.    Impression and Plan:  Essential hypertension  -Blood pressure remains elevated at today's visit. -He will continue to take ambulatory measurements and bring that log into our next visit. -If at his next visit blood pressure remains elevated, we may need to continue adjusting antihypertensive therapy.  Type 2 diabetes mellitus with peripheral neuropathy (HCC), Chronic -A1c today demonstrates excellent control at 5.8. -Given his A1c has decreased, I have told him he can continue taking metformin at thousand milligrams as opposed to 2000 which is what he has been prescribed. -He has been advised to schedule diabetic eye exam.  Dyslipidemia -Continue simvastatin. -Most recent LDL was 79 in May 2019.  Peripheral polyneuropathy  -Does not have relief with Cymbalta. -He will discontinue  Cymbalta and start low-dose Neurontin 100 mg at bedtime.  He has been advised to notify us if he experiences increased sedation, or no significant improvement in symptoms.   Patient Instructions  -It was nice see you today!  -Stop taking Cymbalta.  -Start taking Neurontin 100 mg at bedtime for peripheral neuropathy.  Please let us know if you experience increased sedation or if you feel like the medication is not helping enough.  -Please make sure you schedule your annual diabetic eye exam.  -Schedule follow-up with [place] in May for your annual physical exam.  Please come in fasting to that visit.     Chaya Jan, MD [facility]

## 2018-06-03 ENCOUNTER — Encounter: Payer: Self-pay | Admitting: Certified Nurse Midwife

## 2018-06-15 DIAGNOSIS — H2513 Age-related nuclear cataract, bilateral: Secondary | ICD-10-CM | POA: Diagnosis not present

## 2018-06-15 DIAGNOSIS — H52223 Regular astigmatism, bilateral: Secondary | ICD-10-CM | POA: Diagnosis not present

## 2018-07-16 ENCOUNTER — Inpatient Hospital Stay (HOSPITAL_COMMUNITY): Payer: Medicare Other

## 2018-07-16 ENCOUNTER — Telehealth: Payer: Self-pay | Admitting: Internal Medicine

## 2018-07-16 ENCOUNTER — Inpatient Hospital Stay (HOSPITAL_COMMUNITY)
Admission: AD | Admit: 2018-07-16 | Discharge: 2018-08-13 | DRG: 871 | Disposition: E | Payer: Medicare Other | Source: Other Acute Inpatient Hospital | Attending: Pulmonary Disease | Admitting: Pulmonary Disease

## 2018-07-16 DIAGNOSIS — A419 Sepsis, unspecified organism: Secondary | ICD-10-CM | POA: Diagnosis not present

## 2018-07-16 DIAGNOSIS — K8020 Calculus of gallbladder without cholecystitis without obstruction: Secondary | ICD-10-CM | POA: Diagnosis not present

## 2018-07-16 DIAGNOSIS — E860 Dehydration: Secondary | ICD-10-CM | POA: Diagnosis not present

## 2018-07-16 DIAGNOSIS — I472 Ventricular tachycardia: Secondary | ICD-10-CM | POA: Diagnosis not present

## 2018-07-16 DIAGNOSIS — N136 Pyonephrosis: Secondary | ICD-10-CM | POA: Diagnosis present

## 2018-07-16 DIAGNOSIS — J9601 Acute respiratory failure with hypoxia: Secondary | ICD-10-CM | POA: Diagnosis present

## 2018-07-16 DIAGNOSIS — Z79899 Other long term (current) drug therapy: Secondary | ICD-10-CM

## 2018-07-16 DIAGNOSIS — R0602 Shortness of breath: Secondary | ICD-10-CM

## 2018-07-16 DIAGNOSIS — R6520 Severe sepsis without septic shock: Secondary | ICD-10-CM | POA: Diagnosis not present

## 2018-07-16 DIAGNOSIS — R6521 Severe sepsis with septic shock: Secondary | ICD-10-CM | POA: Diagnosis present

## 2018-07-16 DIAGNOSIS — R4020 Unspecified coma: Secondary | ICD-10-CM | POA: Diagnosis not present

## 2018-07-16 DIAGNOSIS — R918 Other nonspecific abnormal finding of lung field: Secondary | ICD-10-CM | POA: Diagnosis not present

## 2018-07-16 DIAGNOSIS — E872 Acidosis, unspecified: Secondary | ICD-10-CM

## 2018-07-16 DIAGNOSIS — I10 Essential (primary) hypertension: Secondary | ICD-10-CM | POA: Diagnosis present

## 2018-07-16 DIAGNOSIS — Z7982 Long term (current) use of aspirin: Secondary | ICD-10-CM

## 2018-07-16 DIAGNOSIS — I959 Hypotension, unspecified: Secondary | ICD-10-CM | POA: Diagnosis not present

## 2018-07-16 DIAGNOSIS — N201 Calculus of ureter: Secondary | ICD-10-CM | POA: Diagnosis not present

## 2018-07-16 DIAGNOSIS — Z4659 Encounter for fitting and adjustment of other gastrointestinal appliance and device: Secondary | ICD-10-CM

## 2018-07-16 DIAGNOSIS — Z20828 Contact with and (suspected) exposure to other viral communicable diseases: Secondary | ICD-10-CM | POA: Diagnosis present

## 2018-07-16 DIAGNOSIS — N179 Acute kidney failure, unspecified: Secondary | ICD-10-CM | POA: Diagnosis present

## 2018-07-16 DIAGNOSIS — R000 Tachycardia, unspecified: Secondary | ICD-10-CM | POA: Diagnosis not present

## 2018-07-16 DIAGNOSIS — E119 Type 2 diabetes mellitus without complications: Secondary | ICD-10-CM | POA: Diagnosis present

## 2018-07-16 DIAGNOSIS — J986 Disorders of diaphragm: Secondary | ICD-10-CM | POA: Diagnosis present

## 2018-07-16 DIAGNOSIS — I469 Cardiac arrest, cause unspecified: Secondary | ICD-10-CM

## 2018-07-16 DIAGNOSIS — N400 Enlarged prostate without lower urinary tract symptoms: Secondary | ICD-10-CM | POA: Diagnosis present

## 2018-07-16 DIAGNOSIS — Z7984 Long term (current) use of oral hypoglycemic drugs: Secondary | ICD-10-CM

## 2018-07-16 DIAGNOSIS — J189 Pneumonia, unspecified organism: Secondary | ICD-10-CM | POA: Diagnosis present

## 2018-07-16 DIAGNOSIS — J9811 Atelectasis: Secondary | ICD-10-CM | POA: Diagnosis not present

## 2018-07-16 DIAGNOSIS — A4150 Gram-negative sepsis, unspecified: Secondary | ICD-10-CM

## 2018-07-16 DIAGNOSIS — J398 Other specified diseases of upper respiratory tract: Secondary | ICD-10-CM | POA: Diagnosis present

## 2018-07-16 DIAGNOSIS — Z66 Do not resuscitate: Secondary | ICD-10-CM | POA: Diagnosis not present

## 2018-07-16 DIAGNOSIS — M1990 Unspecified osteoarthritis, unspecified site: Secondary | ICD-10-CM | POA: Diagnosis present

## 2018-07-16 DIAGNOSIS — R05 Cough: Secondary | ICD-10-CM | POA: Diagnosis not present

## 2018-07-16 DIAGNOSIS — Z87442 Personal history of urinary calculi: Secondary | ICD-10-CM | POA: Diagnosis not present

## 2018-07-16 DIAGNOSIS — J9690 Respiratory failure, unspecified, unspecified whether with hypoxia or hypercapnia: Secondary | ICD-10-CM | POA: Diagnosis not present

## 2018-07-16 DIAGNOSIS — R935 Abnormal findings on diagnostic imaging of other abdominal regions, including retroperitoneum: Secondary | ICD-10-CM | POA: Diagnosis not present

## 2018-07-16 DIAGNOSIS — Z7189 Other specified counseling: Secondary | ICD-10-CM | POA: Diagnosis not present

## 2018-07-16 DIAGNOSIS — R0902 Hypoxemia: Secondary | ICD-10-CM | POA: Diagnosis not present

## 2018-07-16 DIAGNOSIS — E785 Hyperlipidemia, unspecified: Secondary | ICD-10-CM | POA: Diagnosis present

## 2018-07-16 DIAGNOSIS — N390 Urinary tract infection, site not specified: Secondary | ICD-10-CM | POA: Diagnosis not present

## 2018-07-16 DIAGNOSIS — E1165 Type 2 diabetes mellitus with hyperglycemia: Secondary | ICD-10-CM | POA: Diagnosis not present

## 2018-07-16 DIAGNOSIS — Z452 Encounter for adjustment and management of vascular access device: Secondary | ICD-10-CM | POA: Diagnosis not present

## 2018-07-16 DIAGNOSIS — R34 Anuria and oliguria: Secondary | ICD-10-CM | POA: Diagnosis present

## 2018-07-16 DIAGNOSIS — R0689 Other abnormalities of breathing: Secondary | ICD-10-CM

## 2018-07-16 DIAGNOSIS — N1330 Unspecified hydronephrosis: Secondary | ICD-10-CM

## 2018-07-16 DIAGNOSIS — J939 Pneumothorax, unspecified: Secondary | ICD-10-CM

## 2018-07-16 DIAGNOSIS — R1032 Left lower quadrant pain: Secondary | ICD-10-CM | POA: Diagnosis not present

## 2018-07-16 DIAGNOSIS — Z96659 Presence of unspecified artificial knee joint: Secondary | ICD-10-CM | POA: Diagnosis present

## 2018-07-16 LAB — COMPREHENSIVE METABOLIC PANEL
ALT: 15 U/L (ref 0–44)
AST: 22 U/L (ref 15–41)
Albumin: 2.5 g/dL — ABNORMAL LOW (ref 3.5–5.0)
Alkaline Phosphatase: 112 U/L (ref 38–126)
Anion gap: 11 (ref 5–15)
BUN: 32 mg/dL — ABNORMAL HIGH (ref 8–23)
CO2: 13 mmol/L — ABNORMAL LOW (ref 22–32)
Calcium: 6.5 mg/dL — ABNORMAL LOW (ref 8.9–10.3)
Chloride: 113 mmol/L — ABNORMAL HIGH (ref 98–111)
Creatinine, Ser: 2.53 mg/dL — ABNORMAL HIGH (ref 0.61–1.24)
GFR calc Af Amer: 28 mL/min — ABNORMAL LOW (ref >60)
GFR calc non Af Amer: 24 mL/min — ABNORMAL LOW (ref >60)
Glucose, Bld: 229 mg/dL — ABNORMAL HIGH (ref 70–99)
Potassium: 3.8 mmol/L (ref 3.5–5.1)
Sodium: 137 mmol/L (ref 135–145)
Total Bilirubin: 0.8 mg/dL (ref 0.3–1.2)
Total Protein: 4.9 g/dL — ABNORMAL LOW (ref 6.5–8.1)

## 2018-07-16 LAB — CBC
HCT: 36.9 % — ABNORMAL LOW (ref 39.0–52.0)
Hemoglobin: 11.6 g/dL — ABNORMAL LOW (ref 13.0–17.0)
MCH: 30.7 pg (ref 26.0–34.0)
MCHC: 31.4 g/dL (ref 30.0–36.0)
MCV: 97.6 fL (ref 80.0–100.0)
Platelets: 163 10*3/uL (ref 150–400)
RBC: 3.78 MIL/uL — ABNORMAL LOW (ref 4.22–5.81)
RDW: 14 % (ref 11.5–15.5)
WBC: 31.9 10*3/uL — ABNORMAL HIGH (ref 4.0–10.5)
nRBC: 0 % (ref 0.0–0.2)

## 2018-07-16 LAB — BLOOD GAS, ARTERIAL
Acid-base deficit: 11.6 mmol/L — ABNORMAL HIGH (ref 0.0–2.0)
Bicarbonate: 14.8 mmol/L — ABNORMAL LOW (ref 20.0–28.0)
Drawn by: 11249
FIO2: 100
MECHVT: 500 mL
O2 Saturation: 98.8 %
PEEP: 10 cmH2O
Patient temperature: 96.7
RATE: 22 resp/min
pCO2 arterial: 34.3 mmHg (ref 32.0–48.0)
pH, Arterial: 7.25 — ABNORMAL LOW (ref 7.350–7.450)
pO2, Arterial: 149 mmHg — ABNORMAL HIGH (ref 83.0–108.0)

## 2018-07-16 LAB — MAGNESIUM: Magnesium: 1.5 mg/dL — ABNORMAL LOW (ref 1.7–2.4)

## 2018-07-16 LAB — PROTIME-INR
INR: 1.3 — ABNORMAL HIGH (ref 0.8–1.2)
Prothrombin Time: 15.8 seconds — ABNORMAL HIGH (ref 11.4–15.2)

## 2018-07-16 LAB — D-DIMER, QUANTITATIVE: D-Dimer, Quant: 2.91 ug/mL-FEU — ABNORMAL HIGH (ref 0.00–0.50)

## 2018-07-16 LAB — PHOSPHORUS: Phosphorus: 3.1 mg/dL (ref 2.5–4.6)

## 2018-07-16 MED ORDER — HEPARIN SODIUM (PORCINE) 5000 UNIT/ML IJ SOLN
5000.0000 [IU] | Freq: Three times a day (TID) | INTRAMUSCULAR | Status: DC
  Administered 2018-07-17: 5000 [IU] via SUBCUTANEOUS
  Filled 2018-07-16: qty 1

## 2018-07-16 MED ORDER — MIDAZOLAM 50MG/50ML (1MG/ML) PREMIX INFUSION
0.5000 mg/h | INTRAVENOUS | Status: DC
  Administered 2018-07-17: 0.5 mg/h via INTRAVENOUS

## 2018-07-16 MED ORDER — ONDANSETRON HCL 4 MG PO TABS: 4.0000 mg | ORAL_TABLET | Freq: Four times a day (QID) | ORAL | Status: DC | PRN

## 2018-07-16 MED ORDER — ACETAMINOPHEN 325 MG PO TABS: 650.0000 mg | ORAL_TABLET | Freq: Four times a day (QID) | ORAL | Status: DC | PRN

## 2018-07-16 MED ORDER — VITAMIN C 500 MG PO TABS
500.0000 mg | ORAL_TABLET | Freq: Every day | ORAL | Status: DC
  Filled 2018-07-16: qty 1

## 2018-07-16 MED ORDER — ALBUMIN HUMAN 5 % IV SOLN
25.0000 g | Freq: Once | INTRAVENOUS | Status: AC
  Administered 2018-07-17: 25 g via INTRAVENOUS
  Filled 2018-07-16: qty 500

## 2018-07-16 MED ORDER — PANTOPRAZOLE SODIUM 40 MG IV SOLR
40.0000 mg | Freq: Every day | INTRAVENOUS | Status: DC
  Administered 2018-07-17: 40 mg via INTRAVENOUS
  Filled 2018-07-16: qty 40

## 2018-07-16 MED ORDER — ONDANSETRON HCL 4 MG/2ML IJ SOLN: 4.0000 mg | Freq: Four times a day (QID) | INTRAMUSCULAR | Status: DC | PRN

## 2018-07-16 MED ORDER — FENTANYL 2500MCG IN NS 250ML (10MCG/ML) PREMIX INFUSION
0.0000 ug/h | INTRAVENOUS | Status: DC
  Administered 2018-07-17: 125 ug/h via INTRAVENOUS
  Filled 2018-07-16: qty 250

## 2018-07-16 MED ORDER — LACTATED RINGERS IV SOLN
INTRAVENOUS | Status: DC
  Administered 2018-07-17: 01:00:00 via INTRAVENOUS

## 2018-07-16 MED ORDER — VASOPRESSIN 20 UNIT/ML IV SOLN
0.0300 [IU]/min | INTRAVENOUS | Status: DC
  Administered 2018-07-17 (×2): 0.03 [IU]/min via INTRAVENOUS
  Filled 2018-07-16: qty 2

## 2018-07-16 MED ORDER — INSULIN ASPART 100 UNIT/ML ~~LOC~~ SOLN
2.0000 [IU] | SUBCUTANEOUS | Status: DC
  Administered 2018-07-17 (×3): 6 [IU] via SUBCUTANEOUS

## 2018-07-16 MED ORDER — NOREPINEPHRINE BITARTRATE 1 MG/ML IV SOLN
0.0000 ug/min | INTRAVENOUS | Status: DC
  Administered 2018-07-17: 20 ug/min via INTRAVENOUS
  Filled 2018-07-16: qty 4

## 2018-07-16 MED ORDER — SODIUM CHLORIDE 0.9 % IV BOLUS
1000.0000 mL | Freq: Once | INTRAVENOUS | Status: AC
  Administered 2018-07-16: 1000 mL via INTRAVENOUS

## 2018-07-16 MED ORDER — SODIUM CHLORIDE 0.9 % IV SOLN: INTRAVENOUS | Status: DC

## 2018-07-16 MED ORDER — ZINC SULFATE 220 (50 ZN) MG PO CAPS
220.0000 mg | ORAL_CAPSULE | Freq: Every day | ORAL | Status: DC
  Filled 2018-07-16: qty 1

## 2018-07-16 NOTE — Telephone Encounter (Signed)
Call from Dr  Prentiss Bells at [facility]    COVIDROME profile  - history:  Known DM, BP, ace inhibitotr r->  Fever , Cough, Dyspena and left side belly pain x 2 days, diarrhea x 1 days -> ER with rpid decline and intubated. In ER - Procal 63 but severely lymphopenic with  0, , LDH 770, CRP 75 and AKI 1.7gm% - travel: none - sick cluster exposure: none per ER Doc. ? Went to church in [place] - ER doc will find out - low WBC at admit -highb PMN and 0% lymphs c/w Covid  -PCT: high at 63, lactate 8 - Alternative diagnosis: bowel perf rule out pending - with CT due to lactate and high procal - Course:rapid decomp  Moderate high prob covid but high procal, and pmn is not full c./w covid  Cluster history needs elicitation for covid pre-test probl assessment  Plan - admit [place] icu given prob - await CT - er doc Dr Billy Coast to find out sick contact/cluster hix - If covid not a good candidate for actmera given severe bacterial sepsis     SIGNATURE    Dr. Kalman Shan, M.D., F.C.C.P,  Pulmonary and Critical Care Medicine Staff Physician, [facility] Director - Interstitial Lung Disease  Program  Pulmonary Fibrosis [facility] Network at [facility] [place], [place], [address]  Pager: [phone], If no answer or between  15:00h - 7:00h: call [phone] Telephone: [phone]  4:48 PM 08/01/2018

## 2018-07-17 ENCOUNTER — Inpatient Hospital Stay (HOSPITAL_COMMUNITY): Payer: Medicare Other

## 2018-07-17 DIAGNOSIS — J9601 Acute respiratory failure with hypoxia: Secondary | ICD-10-CM

## 2018-07-17 DIAGNOSIS — A419 Sepsis, unspecified organism: Secondary | ICD-10-CM

## 2018-07-17 DIAGNOSIS — N1330 Unspecified hydronephrosis: Secondary | ICD-10-CM

## 2018-07-17 DIAGNOSIS — E872 Acidosis, unspecified: Secondary | ICD-10-CM

## 2018-07-17 DIAGNOSIS — R6521 Severe sepsis with septic shock: Secondary | ICD-10-CM

## 2018-07-17 DIAGNOSIS — N179 Acute kidney failure, unspecified: Secondary | ICD-10-CM

## 2018-07-17 DIAGNOSIS — Z7189 Other specified counseling: Secondary | ICD-10-CM

## 2018-07-17 DIAGNOSIS — I469 Cardiac arrest, cause unspecified: Secondary | ICD-10-CM

## 2018-07-17 LAB — LACTIC ACID, PLASMA
Lactic Acid, Venous: 4.4 mmol/L (ref 0.5–1.9)
Lactic Acid, Venous: 5.9 mmol/L (ref 0.5–1.9)
Lactic Acid, Venous: 5.3 mmol/L (ref 0.5–1.9)
Lactic Acid, Venous: 5.7 mmol/L (ref 0.5–1.9)
Lactic Acid, Venous: 10.5 mmol/L (ref 0.5–1.9)

## 2018-07-17 LAB — CBC
HCT: 34.4 % — ABNORMAL LOW (ref 39.0–52.0)
Hemoglobin: 10.3 g/dL — ABNORMAL LOW (ref 13.0–17.0)
MCH: 30 pg (ref 26.0–34.0)
MCHC: 29.9 g/dL — ABNORMAL LOW (ref 30.0–36.0)
MCV: 100.3 fL — ABNORMAL HIGH (ref 80.0–100.0)
Platelets: 85 10*3/uL — ABNORMAL LOW (ref 150–400)
RBC: 3.43 MIL/uL — ABNORMAL LOW (ref 4.22–5.81)
RDW: 14.5 % (ref 11.5–15.5)
WBC: 37.9 10*3/uL — ABNORMAL HIGH (ref 4.0–10.5)
nRBC: 0.1 % (ref 0.0–0.2)

## 2018-07-17 LAB — COMPREHENSIVE METABOLIC PANEL
ALT: 16 U/L (ref 0–44)
AST: 23 U/L (ref 15–41)
Albumin: 3 g/dL — ABNORMAL LOW (ref 3.5–5.0)
Alkaline Phosphatase: 67 U/L (ref 38–126)
Anion gap: 12 (ref 5–15)
BUN: 35 mg/dL — ABNORMAL HIGH (ref 8–23)
CO2: 14 mmol/L — ABNORMAL LOW (ref 22–32)
Calcium: 6.5 mg/dL — ABNORMAL LOW (ref 8.9–10.3)
Chloride: 109 mmol/L (ref 98–111)
Creatinine, Ser: 2.85 mg/dL — ABNORMAL HIGH (ref 0.61–1.24)
GFR calc Af Amer: 24 mL/min — ABNORMAL LOW (ref >60)
GFR calc non Af Amer: 21 mL/min — ABNORMAL LOW (ref >60)
Glucose, Bld: 284 mg/dL — ABNORMAL HIGH (ref 70–99)
Potassium: 4.4 mmol/L (ref 3.5–5.1)
Sodium: 135 mmol/L (ref 135–145)
Total Bilirubin: 1.1 mg/dL (ref 0.3–1.2)
Total Protein: 5.4 g/dL — ABNORMAL LOW (ref 6.5–8.1)

## 2018-07-17 LAB — BLOOD GAS, ARTERIAL
Acid-base deficit: 13 mmol/L — ABNORMAL HIGH (ref 0.0–2.0)
Acid-base deficit: 13.5 mmol/L — ABNORMAL HIGH (ref 0.0–2.0)
Bicarbonate: 12.2 mmol/L — ABNORMAL LOW (ref 20.0–28.0)
Bicarbonate: 13.1 mmol/L — ABNORMAL LOW (ref 20.0–28.0)
Drawn by: 257881
Drawn by: 257881
FIO2: 50
FIO2: 45
MECHVT: 570 mL
MECHVT: 570 mL
O2 Saturation: 98.3 %
O2 Saturation: 91.9 %
PEEP: 12 cmH2O
PEEP: 12 cmH2O
Patient temperature: 98.6
Patient temperature: 98.6
RATE: 30 resp/min
RATE: 30 resp/min
pCO2 arterial: 26.9 mmHg — ABNORMAL LOW (ref 32.0–48.0)
pCO2 arterial: 33.8 mmHg (ref 32.0–48.0)
pH, Arterial: 7.279 — ABNORMAL LOW (ref 7.350–7.450)
pH, Arterial: 7.213 — ABNORMAL LOW (ref 7.350–7.450)
pO2, Arterial: 119 mmHg — ABNORMAL HIGH (ref 83.0–108.0)
pO2, Arterial: 70.7 mmHg — ABNORMAL LOW (ref 83.0–108.0)

## 2018-07-17 LAB — TYPE AND SCREEN
ABO/RH(D): O NEG
Antibody Screen: NEGATIVE

## 2018-07-17 LAB — URINALYSIS, ROUTINE W REFLEX MICROSCOPIC
Bilirubin Urine: NEGATIVE
Glucose, UA: 50 mg/dL — AB
Ketones, ur: NEGATIVE mg/dL
Nitrite: NEGATIVE
Protein, ur: 100 mg/dL — AB
RBC / HPF: >50 RBC/hpf — ABNORMAL HIGH (ref 0–5)
Specific Gravity, Urine: 1.018 (ref 1.005–1.030)
pH: 5 (ref 5.0–8.0)

## 2018-07-17 LAB — CBC WITH DIFFERENTIAL/PLATELET
Abs Immature Granulocytes: 3.18 10*3/uL — ABNORMAL HIGH (ref 0.00–0.07)
Basophils Absolute: 0.2 10*3/uL — ABNORMAL HIGH (ref 0.0–0.1)
Basophils Relative: 0 %
Eosinophils Absolute: 0.2 10*3/uL (ref 0.0–0.5)
Eosinophils Relative: 1 %
HCT: 36.1 % — ABNORMAL LOW (ref 39.0–52.0)
Hemoglobin: 11.5 g/dL — ABNORMAL LOW (ref 13.0–17.0)
Immature Granulocytes: 9 %
Lymphocytes Relative: 3 %
Lymphs Abs: 0.9 10*3/uL (ref 0.7–4.0)
MCH: 30.9 pg (ref 26.0–34.0)
MCHC: 31.9 g/dL (ref 30.0–36.0)
MCV: 97 fL (ref 80.0–100.0)
Monocytes Absolute: 2 10*3/uL — ABNORMAL HIGH (ref 0.1–1.0)
Monocytes Relative: 6 %
Neutro Abs: 30.3 10*3/uL — ABNORMAL HIGH (ref 1.7–7.7)
Neutrophils Relative %: 81 %
Platelets: 117 10*3/uL — ABNORMAL LOW (ref 150–400)
RBC: 3.72 MIL/uL — ABNORMAL LOW (ref 4.22–5.81)
RDW: 14.3 % (ref 11.5–15.5)
WBC Morphology: INCREASED
WBC: 36.8 10*3/uL — ABNORMAL HIGH (ref 4.0–10.5)
nRBC: 0.1 % (ref 0.0–0.2)

## 2018-07-17 LAB — BASIC METABOLIC PANEL
Anion gap: 12 (ref 5–15)
Anion gap: 10 (ref 5–15)
BUN: 37 mg/dL — ABNORMAL HIGH (ref 8–23)
BUN: 36 mg/dL — ABNORMAL HIGH (ref 8–23)
CO2: 15 mmol/L — ABNORMAL LOW (ref 22–32)
CO2: 21 mmol/L — ABNORMAL LOW (ref 22–32)
Calcium: 6.5 mg/dL — ABNORMAL LOW (ref 8.9–10.3)
Calcium: 6.1 mg/dL — CL (ref 8.9–10.3)
Chloride: 108 mmol/L (ref 98–111)
Chloride: 104 mmol/L (ref 98–111)
Creatinine, Ser: 3.51 mg/dL — ABNORMAL HIGH (ref 0.61–1.24)
Creatinine, Ser: 3.57 mg/dL — ABNORMAL HIGH (ref 0.61–1.24)
GFR calc Af Amer: 19 mL/min — ABNORMAL LOW (ref >60)
GFR calc Af Amer: 18 mL/min — ABNORMAL LOW (ref >60)
GFR calc non Af Amer: 16 mL/min — ABNORMAL LOW (ref >60)
GFR calc non Af Amer: 16 mL/min — ABNORMAL LOW (ref >60)
Glucose, Bld: 249 mg/dL — ABNORMAL HIGH (ref 70–99)
Glucose, Bld: 350 mg/dL — ABNORMAL HIGH (ref 70–99)
Potassium: 5.4 mmol/L — ABNORMAL HIGH (ref 3.5–5.1)
Potassium: 5.1 mmol/L (ref 3.5–5.1)
Sodium: 135 mmol/L (ref 135–145)
Sodium: 135 mmol/L (ref 135–145)

## 2018-07-17 LAB — C-REACTIVE PROTEIN
CRP: 17.5 mg/dL — ABNORMAL HIGH (ref <1.0)
CRP: 25.2 mg/dL — ABNORMAL HIGH (ref <1.0)

## 2018-07-17 LAB — CK: Total CK: 176 U/L (ref 49–397)

## 2018-07-17 LAB — GLUCOSE, CAPILLARY
Glucose-Capillary: 208 mg/dL — ABNORMAL HIGH (ref 70–99)
Glucose-Capillary: 234 mg/dL — ABNORMAL HIGH (ref 70–99)
Glucose-Capillary: 228 mg/dL — ABNORMAL HIGH (ref 70–99)
Glucose-Capillary: 214 mg/dL — ABNORMAL HIGH (ref 70–99)
Glucose-Capillary: 198 mg/dL — ABNORMAL HIGH (ref 70–99)

## 2018-07-17 LAB — FERRITIN: Ferritin: 455 ng/mL — ABNORMAL HIGH (ref 24–336)

## 2018-07-17 LAB — PHOSPHORUS
Phosphorus: 4.5 mg/dL (ref 2.5–4.6)
Phosphorus: 4.7 mg/dL — ABNORMAL HIGH (ref 2.5–4.6)

## 2018-07-17 LAB — MAGNESIUM
Magnesium: 1.4 mg/dL — ABNORMAL LOW (ref 1.7–2.4)
Magnesium: 1.5 mg/dL — ABNORMAL LOW (ref 1.7–2.4)
Magnesium: 1.4 mg/dL — ABNORMAL LOW (ref 1.7–2.4)

## 2018-07-17 MED ORDER — VITAL HIGH PROTEIN PO LIQD
1000.0000 mL | ORAL | Status: DC
  Administered 2018-07-17: 1000 mL

## 2018-07-17 MED ORDER — AMIODARONE HCL IN DEXTROSE 360-4.14 MG/200ML-% IV SOLN
INTRAVENOUS | Status: AC
  Filled 2018-07-17: qty 200

## 2018-07-17 MED ORDER — CALCIUM GLUCONATE-NACL 1-0.675 GM/50ML-% IV SOLN
INTRAVENOUS | Status: AC
  Administered 2018-07-17: 1000 mg
  Filled 2018-07-17: qty 50

## 2018-07-17 MED ORDER — ENOXAPARIN SODIUM 30 MG/0.3ML ~~LOC~~ SOLN: 30.0000 mg | SUBCUTANEOUS | Status: DC

## 2018-07-17 MED ORDER — LACTATED RINGERS IV BOLUS
2000.0000 mL | Freq: Once | INTRAVENOUS | Status: AC
  Administered 2018-07-17: 1000 mL via INTRAVENOUS

## 2018-07-17 MED ORDER — PRO-STAT SUGAR FREE PO LIQD
30.0000 mL | Freq: Two times a day (BID) | ORAL | Status: DC
  Administered 2018-07-17: 30 mL
  Filled 2018-07-17: qty 30

## 2018-07-17 MED ORDER — FENTANYL CITRATE (PF) 100 MCG/2ML IJ SOLN
50.0000 ug | INTRAMUSCULAR | Status: DC | PRN
  Administered 2018-07-17 (×2): 50 ug via INTRAVENOUS

## 2018-07-17 MED ORDER — FENTANYL CITRATE (PF) 100 MCG/2ML IJ SOLN: 50.0000 ug | INTRAMUSCULAR | Status: DC | PRN

## 2018-07-17 MED ORDER — MIDAZOLAM HCL 2 MG/2ML IJ SOLN
1.0000 mg | INTRAMUSCULAR | Status: DC | PRN
  Administered 2018-07-17 (×2): 1 mg via INTRAVENOUS
  Filled 2018-07-17: qty 2

## 2018-07-17 MED ORDER — INSULIN ASPART 100 UNIT/ML ~~LOC~~ SOLN
0.0000 [IU] | SUBCUTANEOUS | Status: DC
  Administered 2018-07-17: 7 [IU] via SUBCUTANEOUS
  Administered 2018-07-17: 4 [IU] via SUBCUTANEOUS

## 2018-07-17 MED ORDER — MIDAZOLAM HCL 2 MG/2ML IJ SOLN
1.0000 mg | INTRAMUSCULAR | Status: DC | PRN
  Administered 2018-07-17 (×2): 1 mg via INTRAVENOUS
  Filled 2018-07-17 (×2): qty 2

## 2018-07-17 MED ORDER — SODIUM BICARBONATE 8.4 % IV SOLN
INTRAVENOUS | Status: DC
  Administered 2018-07-17: 11:00:00 via INTRAVENOUS
  Filled 2018-07-17: qty 150

## 2018-07-17 MED ORDER — SODIUM CHLORIDE 0.9 % IV SOLN
500.0000 mg | Freq: Every day | INTRAVENOUS | Status: DC
  Administered 2018-07-17: 500 mg via INTRAVENOUS
  Filled 2018-07-17: qty 500

## 2018-07-17 MED ORDER — SODIUM CHLORIDE 0.9 % IV SOLN
1.0000 g | INTRAVENOUS | Status: DC
  Filled 2018-07-17: qty 1

## 2018-07-17 MED ORDER — SODIUM CHLORIDE 0.9 % IV SOLN
1.0000 g | Freq: Two times a day (BID) | INTRAVENOUS | Status: DC
  Administered 2018-07-17: 1 g via INTRAVENOUS
  Filled 2018-07-17 (×2): qty 1

## 2018-07-17 MED ORDER — SODIUM CHLORIDE 0.9% FLUSH: 10.0000 mL | INTRAVENOUS | Status: DC | PRN

## 2018-07-17 MED ORDER — EPINEPHRINE PF 1 MG/ML IJ SOLN
0.5000 ug/min | INTRAVENOUS | Status: DC
  Filled 2018-07-17: qty 4

## 2018-07-17 MED ORDER — NOREPINEPHRINE 4 MG/250ML-% IV SOLN
0.0000 ug/min | INTRAVENOUS | Status: DC
  Administered 2018-07-17: 16 ug/min via INTRAVENOUS
  Filled 2018-07-17 (×2): qty 250

## 2018-07-17 MED ORDER — CHLORHEXIDINE GLUCONATE CLOTH 2 % EX PADS: 6.0000 | MEDICATED_PAD | Freq: Every day | CUTANEOUS | Status: DC

## 2018-07-17 MED ORDER — ORAL CARE MOUTH RINSE
15.0000 mL | OROMUCOSAL | Status: DC
  Administered 2018-07-17 (×5): 15 mL via OROMUCOSAL

## 2018-07-17 MED ORDER — DEXMEDETOMIDINE HCL IN NACL 200 MCG/50ML IV SOLN
0.0000 ug/kg/h | INTRAVENOUS | Status: DC
  Administered 2018-07-17: 0.1 ug/kg/h via INTRAVENOUS
  Administered 2018-07-17: 0.4 ug/kg/h via INTRAVENOUS
  Filled 2018-07-17 (×5): qty 50

## 2018-07-17 MED ORDER — CHLORHEXIDINE GLUCONATE 0.12% ORAL RINSE (MEDLINE KIT): 15.0000 mL | Freq: Two times a day (BID) | OROMUCOSAL | Status: DC

## 2018-07-18 LAB — URINE CULTURE: Culture: NO GROWTH

## 2018-07-18 LAB — LEGIONELLA PNEUMOPHILA SEROGP 1 UR AG: L. pneumophila Serogp 1 Ur Ag: NEGATIVE

## 2018-07-19 LAB — NOVEL CORONAVIRUS, NAA (HOSP ORDER, SEND-OUT TO REF LAB; TAT 18-24 HRS): SARS-CoV-2, NAA: NOT DETECTED

## 2018-07-19 MED FILL — Dextrose Inj 5%: INTRAVENOUS | Qty: 250 | Status: AC

## 2018-07-19 MED FILL — Norepinephrine Bitartrate IV Soln 1 MG/ML (Base Equivalent): INTRAVENOUS | Qty: 4 | Status: AC

## 2018-07-20 ENCOUNTER — Telehealth: Payer: Self-pay | Admitting: *Deleted

## 2018-07-20 LAB — BLOOD GAS, VENOUS
Acid-base deficit: 12.8 mmol/L — ABNORMAL HIGH (ref 0.0–2.0)
Bicarbonate: 14.8 mmol/L — ABNORMAL LOW (ref 20.0–28.0)
Drawn by: 257881
FIO2: 80
O2 Saturation: 42.7 %
PEEP: 12 cmH2O
Patient temperature: 98.6
RATE: 30 resp/min
pCO2, Ven: 41.6 mmHg — ABNORMAL LOW (ref 44.0–60.0)
pH, Ven: 7.177 — CL (ref 7.250–7.430)

## 2018-07-20 LAB — INTERLEUKIN-6, PLASMA: Interleukin-6, Plasma: 1440.1 pg/mL — ABNORMAL HIGH (ref 0.0–12.2)

## 2018-07-20 NOTE — Telephone Encounter (Signed)
Received a faxed D/C from [employer]  ,D/C forwarded to Dr.Sood on 2H for signature.

## 2018-07-21 NOTE — Telephone Encounter (Signed)
Received a signed faxed copied D/C- D/C faxed to funeral home as requested.

## 2018-07-22 LAB — CULTURE, BLOOD (ROUTINE X 2)
Culture: NO GROWTH
Culture: NO GROWTH
Special Requests: ADEQUATE
Special Requests: ADEQUATE

## 2018-07-27 ENCOUNTER — Telehealth: Payer: Self-pay

## 2018-07-27 NOTE — Telephone Encounter (Signed)
Received dc from [facility] (original). DC is for cremation. Patient is a patient of Doctor Everardo All. DC will be taken to [address] [place] for signature.

## 2018-08-05 NOTE — Telephone Encounter (Signed)
08/05/2018 received signed D/C back. Will call [employer] to pick up. pwr

## 2018-08-13 NOTE — Progress Notes (Signed)
CRITICAL VALUE ALERT  Critical Value:  Lactic Acid 5.7  Date & Time Notied:  07/27/2018 1655  Provider Notified: Everardo All, MD  Orders Received/Actions taken: provider at bedside.

## 2018-08-13 NOTE — Progress Notes (Signed)
CRITICAL VALUE ALERT  Critical Value: Lactic Acid 5.3  Date & Time Notied: 08/05/18 1347  Provider Notified: Dr. Everardo All  Orders Received/Actions taken: MD to assess.

## 2018-08-13 NOTE — Progress Notes (Signed)
Critical Event  Patient went into PEA arrest. Patient DNR. Expired at 5:59.  Mechele Collin, M.D. [facility] Pulmonary/Critical Care Medicine Pager: [phone] After hours pager: [phone]

## 2018-08-13 NOTE — Progress Notes (Signed)
CRITICAL VALUE ALERT  Critical Value:  Calcium 6.1  Date & Time Notied:  08/03/2018 1740  Provider Notified: Everardo All, MD  Orders Received/Actions taken: provider at bedside

## 2018-08-13 NOTE — Progress Notes (Signed)
Patient negative for COVID-19

## 2018-08-13 NOTE — Progress Notes (Signed)
Patient expired at 1756 confirmed with two RN's, Silvio Pate and Angelique Holm.  MD notified wife.

## 2018-08-13 NOTE — Progress Notes (Signed)
CRITICAL VALUE ALERT  Critical Value:  Lactic Acid 5.9 Date & Time Notied:  08/02/2018 1118  Provider Notified: Dr. Everardo All  Orders Received/Actions taken: New orders placed.

## 2018-08-13 NOTE — Progress Notes (Signed)
NAME:  Jerry Beasley, MRN:  [id number], DOB:  October 13, 1945, LOS: 1 ADMISSION DATE:  08/13/18, CONSULTATION DATE:  13-Aug-2018 REFERRING MD:  OSH, CHIEF COMPLAINT:   abdominal pain  Brief History   73 y/o male admitted from [facility] hospital with abdominal pain, severe sepsis, respiratory failure.  There was concern for sepsis of a urinary origin as he had abdominal pain and evidence of nephrolithiasis on CT abdomen.     Past Medical History  Nephrolithiasis Hypertension Hyperlipidemia  Significant Hospital Events     Consults:    Procedures:  4/3 ETT 4/3 L IJ CVL >   Significant Diagnostic Tests:  4/3 CT abdomen [place]: atelectasis lung bases, tracheobronchomalacia, UVJ stone passed, mild left hydro, diverticulosis but not diverticulitis  Micro Data:  4/3 blood culture [facility] 4/4> Gram negative rods, no speciation yet   4/3 urine culture [place]> NGTD 4/3 Novel Corona virus [place]>  4/3 blood > 4/4 RSV >   Antimicrobials:  4/3 cefepime >  4/3 azithro > 4/4  Interim history/subjective:  Remains mechanically ventilated and on vasopressors Making some urine  Objective   Blood pressure (!) 110/30, pulse 99, temperature 98.9 F (37.2 C), resp. rate (!) 30, height 5\' 9"  (1.753 m), weight 107 kg, SpO2 99 %.    Vent Mode: PRVC FiO2 (%):  [80 %-100 %] 80 % Set Rate:  [22 bmp] 22 bmp Vt Set:  [500 mL] 500 mL PEEP:  [10 cmH20-12 cmH20] 12 cmH20 Plateau Pressure:  [23 cmH20] 23 cmH20   Intake/Output Summary (Last 24 hours) at 07/24/2018 0815 Last data filed at 08/08/2018 0600 Gross per 24 hour  Intake 1786.06 ml  Output 85 ml  Net 1701.06 ml   Filed Weights   07/25/2018 0200  Weight: 107 kg    Examination:  General:  In bed on vent HENT: NCAT ETT in place PULM: CTA B, vent supported breathing CV: RRR, no mgr GI: BS+, soft, nontender MSK: normal bulk and tone Neuro: sedated on vent Pulses intact, good cap refill   Resolved Hospital Problem list      Assessment & Plan:  Septic shock: appears volume replete on exam > check CVP > bolus another liter of LR now > wean Levophed and vasopressin for MAP > 65 > repeat lactic acid  Gram negative rod bacteremia from UTI > stop azithromycin > f/u sensitivity/speciation from [place] cultures  Urinary stone: passed based on ultrasound  Possible COVID 19? 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 > wean PEEP/FiO2 for SaO2 > 90% > Full mechanical vent support, maintain ARDS settings for no > VAP prevention > Daily WUA/SBT  AKI, oliguric > bolus LR > Monitor BMET and UOP > Replace electrolytes as needed > repeat BMET later today  Non-gap metabolic acidosis > start bicarbonate infusion > repeat ABG later today  Best practice:  Diet: tube feeding Pain/Anxiety/Delirium protocol (if indicated): yes, RASS goal -1 VAP protocol (if indicated): yes DVT prophylaxis: sub cutaneous heparin GI prophylaxis: pantoprazole Glucose control: change to resistant scale insulin q4 Mobility: bed rest Code Status: full Family Communication: Attempted to call his wife Harriett Sine, could not leave a message Disposition: remain in ICU  Labs   CBC: Recent Labs  Lab Aug 13, 2018 2218 08/06/2018 0530  WBC 31.9* 36.8*  NEUTROABS  --  30.3*  HGB 11.6* 11.5*  HCT 36.9* 36.1*  MCV 97.6 97.0  PLT 163 117*    Basic Metabolic Panel: Recent Labs  Lab 2018/08/06 2218 07/28/2018 0530  NA 137 135  K 3.8 4.4  CL 113* 109  CO2 13* 14*  GLUCOSE 229* 284*  BUN 32* 35*  CREATININE 2.53* 2.85*  CALCIUM 6.5* 6.5*  MG 1.5* 1.4*  PHOS 3.1  --    GFR: Estimated Creatinine Clearance: 27.8 mL/min (A) (by C-G formula based on SCr of 2.85 mg/dL (H)). Recent Labs  Lab 2018-08-06  2218 06-Aug-2018 2258 07/28/2018 0530  WBC 31.9*  --  36.8*  LATICACIDVEN  --  4.4*  --     Liver Function Tests: Recent Labs  Lab August 06, 2018 2218 07/15/2018 0530  AST 22 23  ALT 15 16  ALKPHOS 112 67  BILITOT 0.8 1.1  PROT 4.9* 5.4*  ALBUMIN 2.5* 3.0*   No results for input(s): LIPASE, AMYLASE in the last 168 hours. No results for input(s): AMMONIA in the last 168 hours.  ABG    Component Value Date/Time   PHART 7.250 (L) 08/06/18 2243   PCO2ART 34.3 2018/08/06 2243   PO2ART 149 (H) 2018-08-06 2243   HCO3 14.8 (L) 07/21/2018 0755   ACIDBASEDEF 12.8 (H) 07/30/2018 0755   O2SAT 42.7 07/23/2018 0755     Coagulation Profile: Recent Labs  Lab August 06, 2018 2218  INR 1.3*    Cardiac Enzymes: Recent Labs  Lab 07/31/2018 0530  CKTOTAL 176    HbA1C: Hemoglobin A1C  Date/Time Value Ref Range Status  04/20/2018 09:14 AM 5.8 (A) 4.0 - 5.6 % Final  11/24/2017 11:18 AM 6.2 (A) 4.0 - 5.6 % Final   Hgb A1c MFr Bld  Date/Time Value Ref Range Status  02/09/2017 09:05 AM 6.9 (H) 4.6 - 6.5 % Final    Comment:    Glycemic Control Guidelines for People with Diabetes:Non Diabetic:  <6%Goal of Therapy: <7%Additional Action Suggested:  >8%   07/30/2016 12:20 PM 9.2 (H) 4.6 - 6.5 % Final    Comment:    Glycemic Control Guidelines for People with Diabetes:Non Diabetic:  <6%Goal of Therapy: <7%Additional Action Suggested:  >8%     CBG: Recent Labs  Lab 07/31/2018 0029 07/30/2018 0344  GLUCAP 208* 234*     Critical care time: 45 minutes    Heber Glencoe, MD [place] [facility] Pager: [phone] Cell: [phone] If no response, call [phone]

## 2018-08-13 NOTE — Progress Notes (Addendum)
Critical Event Patient became bradycardic and lost pulse. CPR initiated with ROSC with rhythm sinus tachycardia. Patient then went into ventricular tachycardia with pulse, amiodarone bolus given. Lost pulse and CPR restarted with ROSC.  Called family and spoke with wife and daughter. After discussion, family would not want further chest compressions.   Assessment/Plan: Cardiac Arrest Ventricular tachycardia Septic shock secondary to GNR bacteremia Metabolic acidosis secondary to renal failure  Change code status to DNR Continue current management with epi, amio, bicarb gtt.  Critical Care Time 60 min  Mechele Collin, M.D. [facility] Pulmonary/Critical Care Medicine Pager: [phone] After hours pager: [phone]

## 2018-08-13 NOTE — H&P (Signed)
Name: Jerry Beasley MRN: [id number] DOB: 1945/07/16  Cc: sob, fever, abdominal pain  LOS: 1  [facility] / Critical Care Note   History of Present Illness:  73 yo male with hx of DM, HTN who has been having fever, cough, sob and left sided abdominal pain as well as 1 day hx of diarrhea. Went to OSH ED rapid decompensation there, intubated and central line placed. Labs consistent with lactic acidosis, AKI, elevated WBC. No sick contacts, no recent travel. Has been mostly at home and has taken a few trips to the grocery store.   ROS : + n.v.d, fever, cough , sob 12 point ROS done and negative , pertinent positives are mentioned in the HPI THIS WAS DONE over the phone with wife.  Lines / Drains: RIJ central line 4/3   Cultures: pending   Antibiotics: Cefepime, vanco and azithro   Tests / Events: Transferred from OSH , arrived in shock , high dose levophed on 100 FIO2 on MV.  Trying to get results of CT abdomen done at OSH.    Past Medical History:  Diagnosis Date  . BENIGN PROSTATIC HYPERTROPHY, MILD, HX OF 10/21/2006  . HYPERLIPIDEMIA 10/21/2006  . HYPERTENSION 10/21/2006  . NEPHROLITHIASIS, HX OF 08/30/2007  . 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 KNEE SURGERY    . TONSILLECTOMY    . TOTAL KNEE ARTHROPLASTY      Prior to Admission medications   Medication Sig Start Date End Date Taking? Authorizing Provider  amLODipine (NORVASC) 10 MG tablet Take 1 tablet (10 mg total) by mouth daily. 04/20/18   Philip Aspen, Limmie Patricia, MD  aspirin 81 MG tablet Take 81 mg by mouth daily.    [provider]  benazepril (LOTENSIN) 40 MG tablet Take 1 tablet (40 mg total) by mouth daily. 04/20/18   Philip Aspen, Limmie Patricia, MD  gabapentin (NEURONTIN) 100 MG capsule Take 1 capsule (100 mg total) by mouth at bedtime. 04/20/18   Philip Aspen, Limmie Patricia, MD  glipiZIDE (GLUCOTROL XL) 2.5 MG 24 hr tablet Take 1 tablet (2.5 mg total)  by mouth daily with breakfast. 08/24/17   [name], MD  glucose blood test strip Check blood sugar TID and prn. Dx E11.9 08/15/16   [name], MD  metFORMIN (GLUCOPHAGE-XR) 500 MG 24 hr tablet Take 2 tablets (1,000 mg total) by mouth daily. 04/20/18   Philip Aspen, Limmie Patricia, MD  Multiple Vitamin (MULTI-VITAMIN DAILY PO) Take 1 tablet by mouth daily.    [provider]  simvastatin (ZOCOR) 40 MG tablet Take 1 tablet (40 mg total) by mouth daily. 04/20/18   Henderson Cloud, MD    Allergies No Known Allergies  Family History Family History  Problem Relation Age of Onset  . Hypertension Neg Hx        family hx    Social History  reports that he has never smoked. He has never used smokeless tobacco. He reports current alcohol use. He reports that he does not use drugs.  Review Of Systems:   Vital Signs: FiO2 (%):  [100 %] 100 % (04/03 2200) No intake/output data recorded.  Physical Examination: General: sedated on the vent , intubated Neuro: opens eyes spont, moves all extremities CV: tachycardic with no m/r/g PULM: diffuse ronchi , no crackles or wheezing GI: distended abdomen, BS+ , soft , no guarding or rebound Extremities: trace pedal edema , no cyanosis or  clubbing Skin: dry , capillary refill ~2 seconds IVC [place] ~ 50% collapsing on inspiration.   Ventilator settings: Vent Mode: PRVC FiO2 (%):  [100 %] 100 % Set Rate:  [22 bmp] 22 bmp Vt Set:  [500 mL] 500 mL PEEP:  [10 cmH20] 10 cmH20 Plateau Pressure:  [23 cmH20] 23 cmH20  Labs    CBC Recent Labs  Lab 07/18/2018 2218  HGB 11.6*  HCT 36.9*  WBC 31.9*  PLT 163     BMET Recent Labs  Lab 08/01/2018 2218  NA 137  K 3.8  CL 113*  CO2 13*  GLUCOSE 229*  BUN 32*  CREATININE 2.53*  CALCIUM 6.5*  MG 1.5*  PHOS 3.1    Recent Labs  Lab 07/29/2018 2218  INR 1.3*    Recent Labs  Lab 07/27/2018 2243  PHART 7.250*  PCO2ART 34.3  PO2ART 149*  HCO3 14.8*  O2SAT 98.8      Radiology: CXR diffuse bibasilar infiltrates     Assessment and Plan: Active Problems:   Acute respiratory failure with hypoxemia (HCC)   Septic shock (HCC)   AKI (acute kidney injury) (HCC)  Acute resp failure with hypoxemia - suspect community acquired pneumonia , RVP panel and COVID19 sent and pending Cefepime , vanco and azithro Ferritin pending, legionella and strep pneumo antigens pending CRP elevated PCT elevated Seems to be a bacterial process , but could well be superimposed on a viral infection. His hypoxemia doesn't correlate with CXR findings, which is often seen in COVID19 cases. Droplet and Airborne precautions  Septic shock - CAP? Still trying to obtain results of CT abdomen from outside hospital.  Prelim results are bibasilar consolidations of the lungs and mild left hydro with possible passed stone, urinalysis not available , suspect possible complicated urinary tract infection Will get urology to see and get renal US in am for resolution or not of the left hydro Send urinalysis and urine culture bcx pending Antibiotics as above  AKI - non oliguric , cont fluid resuscitation High ag metabolic acidosis secondary to lactic acidosis, this is trending down Change fluids to LR    Best practices / Disposition: -->Code Status: FULL -->DVT Px: HSQ -->GI : ppi iv -->Diet: npo D/W wife over the phone  Critical care time spent : 60 minutes   Skilar Marcou R. Hanley Hays MD Critical Care Pgr: [phone]  08-03-18, 12:20 AM

## 2018-08-13 NOTE — Progress Notes (Signed)
Pharmacy Antibiotic Note  Jerry Beasley is a 73 y.o. male presented to [facility] on 08/01/18 with c/o fever, cough and SOB. He was intubated on 4/3 and is currently being w/u for COVID-19. Cefepime and azithromycin started on admission for suspected PNA.  Today, 08/02/2018: - currently afeb, wbc up 36.8 - scr up 2.85 (crcl~23 N)  Plan: - change cefepime to 1gm IIV q24h for renal function - continue azithromycin 500 mg IV q24h - f/u with COVID test, renal function, cx __________________________________  Height: 5\' 9"  (175.3 cm) Weight: 236 lb (107 kg) IBW/kg (Calculated) : 70.7  Temp (24hrs), Avg:98.4 F (36.9 C), Min:97.8 F (36.6 C), Max:98.9 F (37.2 C)  Recent Labs  Lab 08-01-18 2218 01-Aug-2018 2258 07/16/2018 0530  WBC 31.9*  --  36.8*  CREATININE 2.53*  --  2.85*  LATICACIDVEN  --  4.4*  --     Estimated Creatinine Clearance: 27.8 mL/min (A) (by C-G formula based on SCr of 2.85 mg/dL (H)).    No Known Allergies   Thank you for allowing pharmacy to be a part of this patient's care.  Lucia Gaskins 07/16/2018 7:29 AM

## 2018-08-13 NOTE — Progress Notes (Signed)
Pharmacy Antibiotic Note  Jerry Beasley is a 73 y.o. male admitted on 08-11-18 with pneumonia.  Pharmacy has been consulted for cefepime dosing.  Plan: Cefepime 1 Gm IV q12h F/u scr/cultures     No data recorded.  Recent Labs  Lab 2018/08/11 2218  WBC 31.9*  CREATININE 2.53*    CrCl cannot be calculated (Unknown ideal weight.).    No Known Allergies  Antimicrobials this admission: 4/4 cefepime >>    >>   Dose adjustments this admission:   Microbiology results:  BCx:   UCx:    Sputum:    MRSA PCR:   Thank you for allowing pharmacy to be a part of this patient's care.  Lorenza Evangelist 08/08/2018 12:41 AM

## 2018-08-13 NOTE — Death Summary Note (Addendum)
DEATH SUMMARY   Patient Details  Name: Jerry Beasley MRN: [id number] DOB: January 16, 1946  Admission/Discharge Information   Admit Date:  08/01/18  Date of Death:  August 02, 2038  Time of Death:  5:59 PM  Length of Stay: 1  Referring Physician: Pccm, Md, MD   Reason(s) for Hospitalization  Respiratory failure  Diagnoses  Preliminary cause of death: Cardiac arrest secondary to metabolic acidosis secondary to renal failure related to septic shock secondary to GNR bacteremia Secondary Diagnoses (including complications and co-morbidities):  Principal Problem:   Gram negative septic shock (HCC) Active Problems:   Acute respiratory failure with hypoxemia (HCC)   AKI (acute kidney injury) (HCC)   PEA (Pulseless electrical activity) (HCC)   Metabolic acidosis   Brief Hospital Course (including significant findings, care, treatment, and services provided and events leading to death)  ANREW Beasley is a 73 y.o. year old male with hx nephrolithiasis, hypertension and hyperlipidemia who presented to [facility] hospital for abdominal pain and found in severe sepsis and acute hypoxemic respiratory failure. Transferred for respiratory failure concerning for COVID-19. Since admission to [facility], hospital course included management of septic shock secondary to gram negative rod bacteremia, metabolic acidosis secondary to renal failure in the setting of shock prompting nephrology consult and acute hypoxemic respiratory failure requiring vent support. Patient decompensated and became bradycardic followed by PEA arrest. After 12 min CPR achieved ROSC however developed ventricular tachycardia with pulse followed by PEA arrest with ROSC. Family called and after discussion, transitioned patient to DNR. Patient again went into PEA arrest and expired.  Pertinent Labs and Studies  Significant Diagnostic Studies US Renal  Result Date: 08/02/2018 CLINICAL DATA:  Left hydronephrosis.  Septic shock. EXAM: RENAL / URINARY  TRACT ULTRASOUND COMPLETE COMPARISON:  CT, August 01, 2018. FINDINGS: Right Kidney: Renal measurements: 12.0 x 5.1 x 5.6 cm = volume: 180 mL. Exophytic mass noted on CT arising from the upper pole the right kidney is a simple appearing cyst on ultrasound measuring 2.5 x 2.3 x 1.9 cm. No other right renal masses. Normal right renal parenchymal echogenicity. No stones or hydronephrosis. Left Kidney: Renal measurements: 13.7 x 5.0 x 6.4 cm = volume: 228 mL. Normal parenchymal echogenicity. No masses or stones. No hydronephrosis. The mild hydronephrosis noted on the previous day's CT is not visualized sonographically. Bladder: Decompressed with a Foley catheter. IMPRESSION: 1. No hydronephrosis. Specifically, the mild left hydronephrosis noted on the prior CT, due to a left ureterovesicular junction stone, is not visualized sonographically. This suggests resolution with stone passage. 2. Superior pole exophytic mass noted on the prior CT is a simple appearing cyst sonographically. No additional follow-up recommended. Electronically Signed   By: Amie Portland M.D.   On: 08-02-2018 06:47   Dg Chest Port 1 View  Result Date: 08/02/2018 CLINICAL DATA:  Intubation. EXAM: PORTABLE CHEST 1 VIEW COMPARISON:  August 01, 2018 FINDINGS: The ETT terminates 4.2 cm below the thoracic inlet, in adequate position. The left central line terminates in the central SVC. There is cardiomegaly. The hila and mediastinum are unchanged. Elevation right hemidiaphragm remains. No focal infiltrate, nodule, or mass. IMPRESSION: 1. The ETT is in good position. The left central line is in good position. 2. Continued elevation of the right hemidiaphragm. 3. No other acute abnormalities noted. Electronically Signed   By: Gerome Sam III M.D   On: August 02, 2018 00:10   Dg Abd Portable 1v  Result Date: 08/02/2018 CLINICAL DATA:  Orogastric tube placement EXAM: PORTABLE ABDOMEN - 1 VIEW COMPARISON:  Abdominal CT from yesterday FINDINGS: Orogastric tube  with tip at the stomach. Lucency over the liver correlates with colonic interposition by CT. Linear opacity at the bases, with consolidative appearance in the left lower lobe, consistent with atelectasis on prior CT. The visualized bowel gas pattern is nonobstructive. IMPRESSION: Enteric tube with tip at the stomach. Electronically Signed   By: Marnee Spring M.D.   On: 07/31/2018 10:20    Microbiology No results found for this or any previous visit (from the past 240 hour(s)).  Lab Basic Metabolic Panel: Recent Labs  Lab 2018/07/23 2218 07/24/2018 0530 07/20/2018 0849 07/30/2018 1200 08/12/2018 1644  NA 137 135  --  135 135  K 3.8 4.4  --  5.4* 5.1  CL 113* 109  --  108 104  CO2 13* 14*  --  15* 21*  GLUCOSE 229* 284*  --  249* 350*  BUN 32* 35*  --  37* 36*  CREATININE 2.53* 2.85*  --  3.51* 3.57*  CALCIUM 6.5* 6.5*  --  6.5* 6.1*  MG 1.5* 1.4* 1.5*  --  1.4*  PHOS 3.1  --  4.5  --  4.7*   Liver Function Tests: Recent Labs  Lab Jul 23, 2018 2218 08/02/2018 0530  AST 22 23  ALT 15 16  ALKPHOS 112 67  BILITOT 0.8 1.1  PROT 4.9* 5.4*  ALBUMIN 2.5* 3.0*   No results for input(s): LIPASE, AMYLASE in the last 168 hours. No results for input(s): AMMONIA in the last 168 hours. CBC: Recent Labs  Lab 23-Jul-2018 2218 08/12/2018 0530 08/05/2018 1744  WBC 31.9* 36.8* 37.9*  NEUTROABS  --  30.3*  --   HGB 11.6* 11.5* 10.3*  HCT 36.9* 36.1* 34.4*  MCV 97.6 97.0 100.3*  PLT 163 117* 85*   Cardiac Enzymes: Recent Labs  Lab 07/14/2018 0530  CKTOTAL 176   Sepsis Labs: Recent Labs  Lab 07-23-18 2218 07-23-18 2258 07/23/2018 0530 08/09/2018 1008 07/30/2018 1200 08/10/2018 1500 07/19/2018 1744  WBC 31.9*  --  36.8*  --   --   --  37.9*  LATICACIDVEN  --  4.4*  --  5.9* 5.3* 5.7*  --     Procedures/Operations   CPR 07/20/2018   Marvelous Bouwens Mechele Collin 08/07/2018, 6:26 PM

## 2018-08-13 DEATH — deceased

## 2018-08-17 ENCOUNTER — Encounter: Payer: Self-pay | Admitting: Internal Medicine

## 2018-08-24 ENCOUNTER — Encounter: Payer: Medicare Other | Admitting: Internal Medicine

## 2019-05-11 IMAGING — DX PORTABLE CHEST - 1 VIEW
1 series · 1 of 1 positions shown · non-contrast
Comparison: July 16, 2018

CLINICAL DATA: Intubation.

EXAM:
PORTABLE CHEST 1 VIEW

[chest ap]
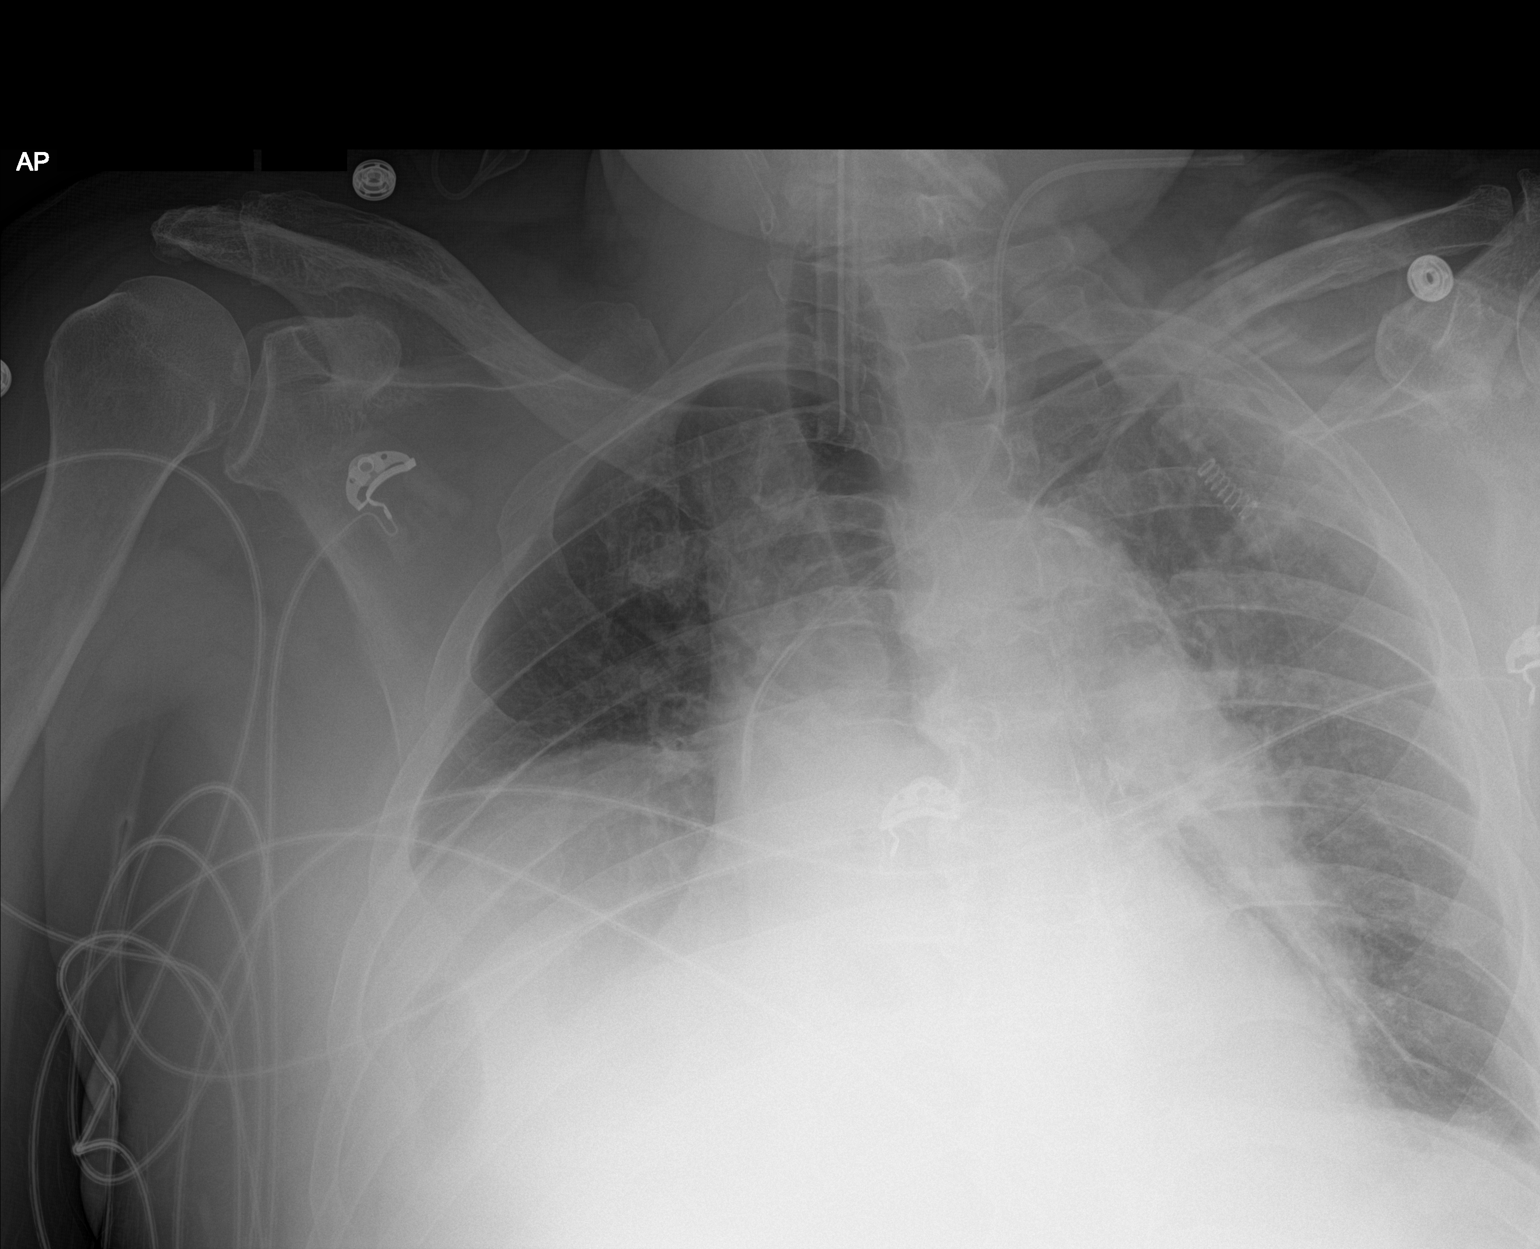

[1 of 1 positions shown; findings below may reference images not displayed]

FINDINGS: The ETT terminates 4.2 cm below the thoracic inlet, in adequate
position. The left central line terminates in the central SVC. There
is cardiomegaly. The hila and mediastinum are unchanged. Elevation
right hemidiaphragm remains. No focal infiltrate, nodule, or mass.
IMPRESSION: 1. The ETT is in good position. The left central line is in good
position.
2. Continued elevation of the right hemidiaphragm.
3. No other acute abnormalities noted.

## 2019-05-12 IMAGING — US US RENAL
1 series · 14 of 21 positions shown · non-contrast
Comparison: CT, 07/16/2018.

CLINICAL DATA: Left hydronephrosis.  Septic shock.

EXAM:
RENAL / URINARY TRACT ULTRASOUND COMPLETE

[Series 1: us renal · 14 of 21 slices shown]
[im 1/21]
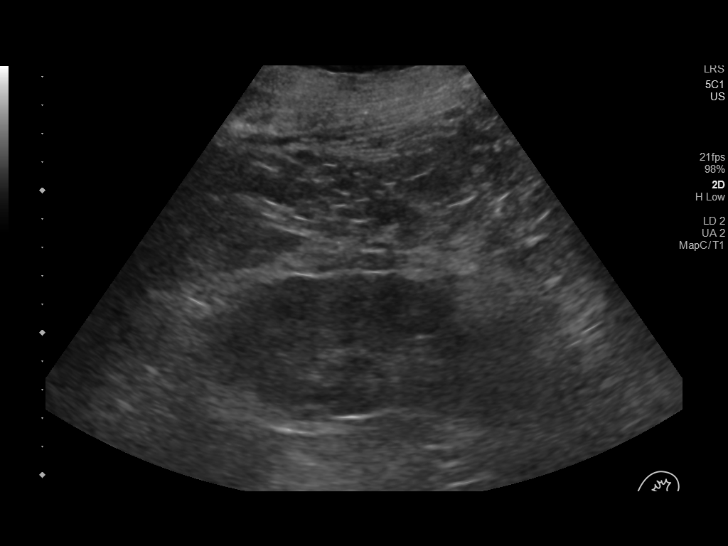
[im 3/21]
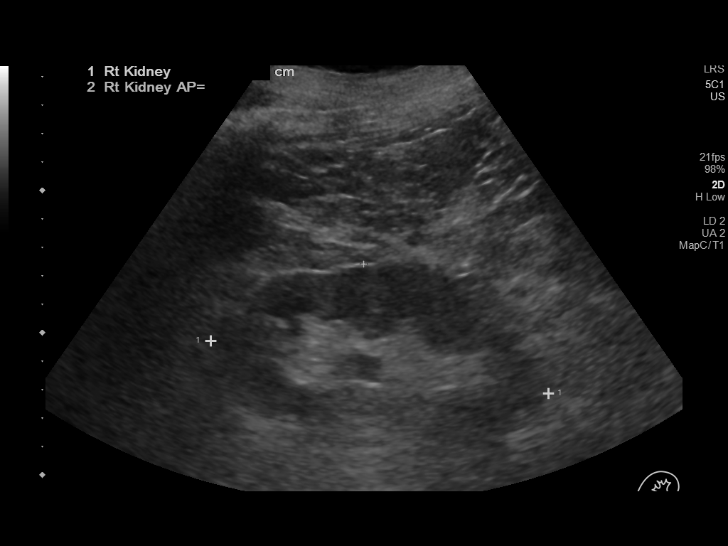
[im 4/21]
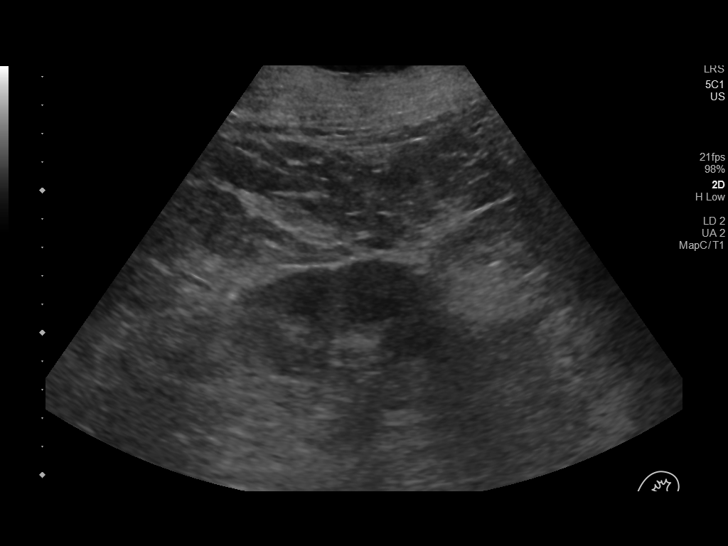
[im 6/21]
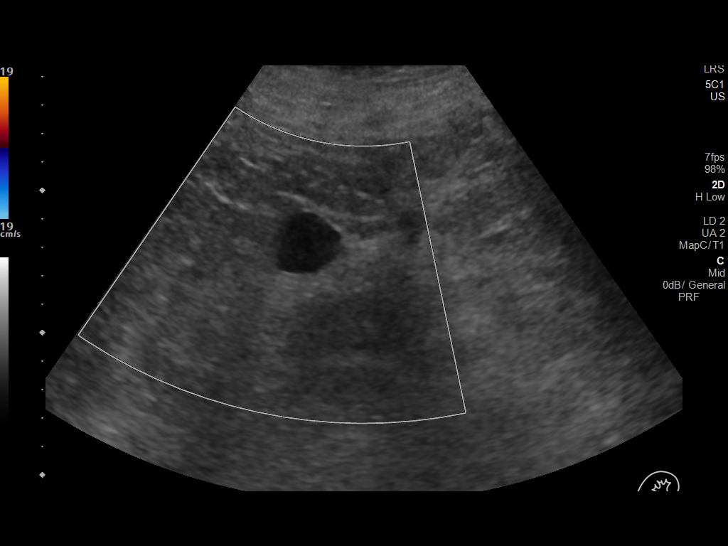
[im 7/21]
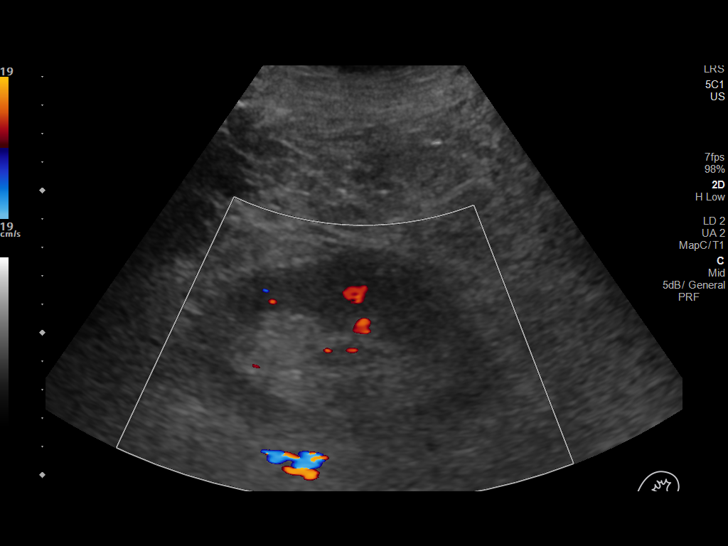
[im 9/21]
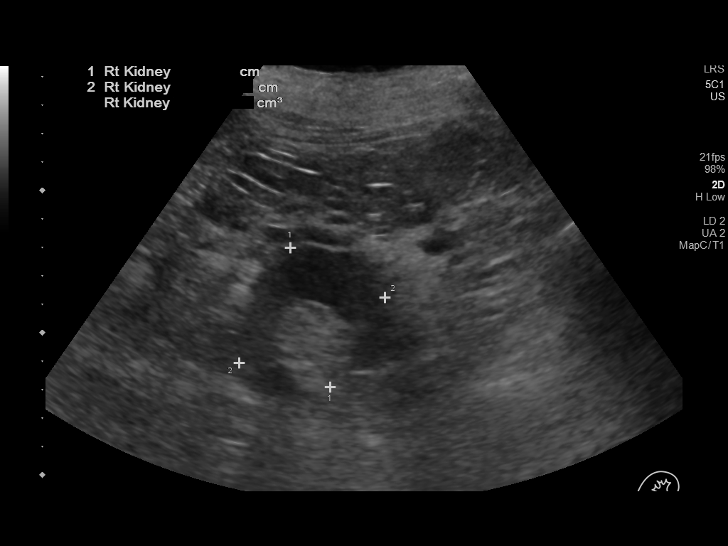
[im 10/21]
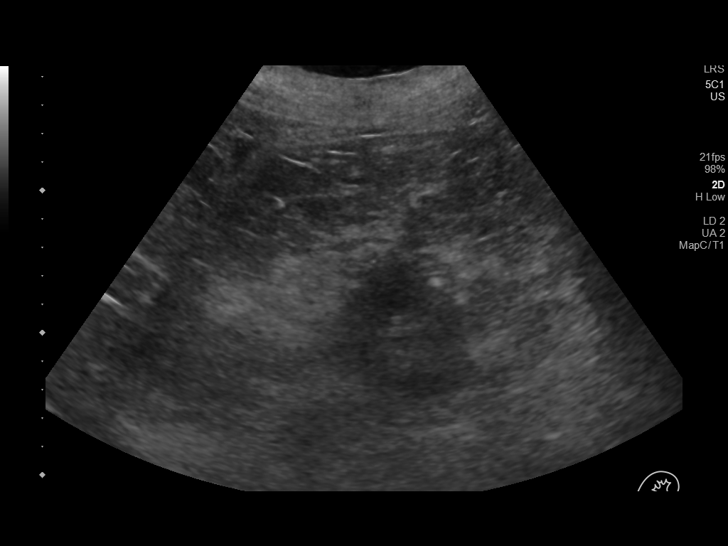
[im 12/21]
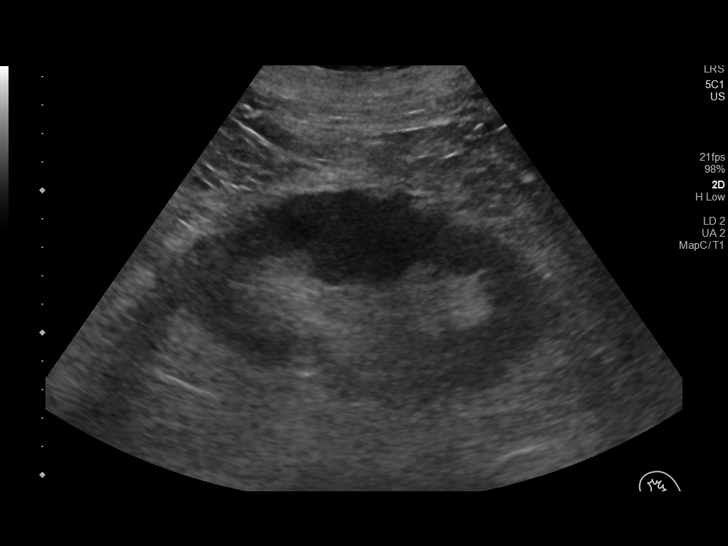
[im 13/21]
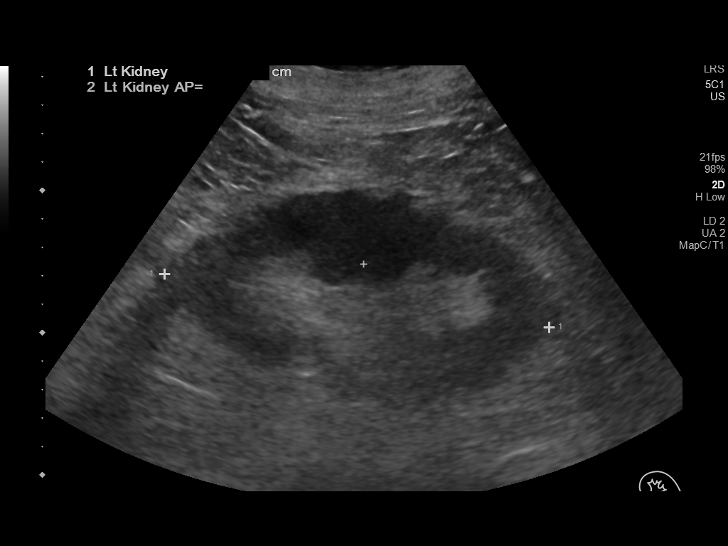
[im 15/21]
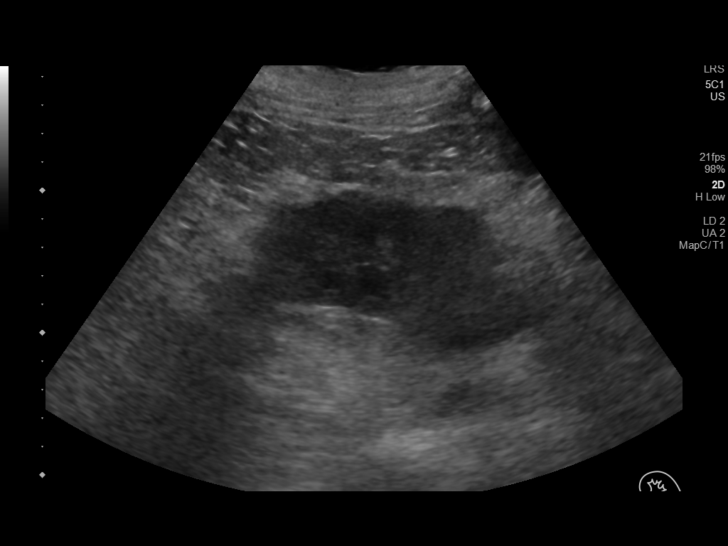
[im 16/21]
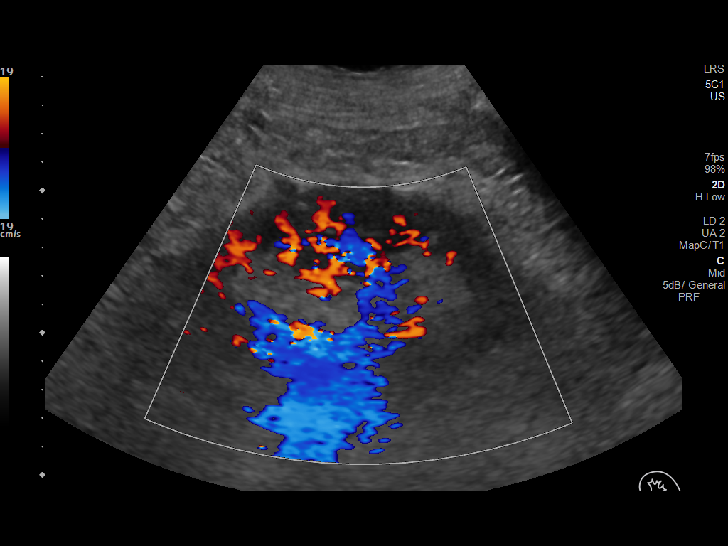
[im 18/21]
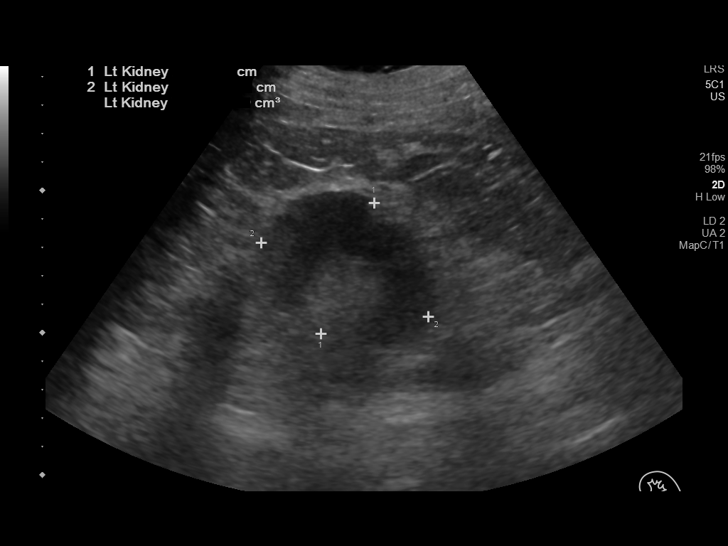
[im 19/21]
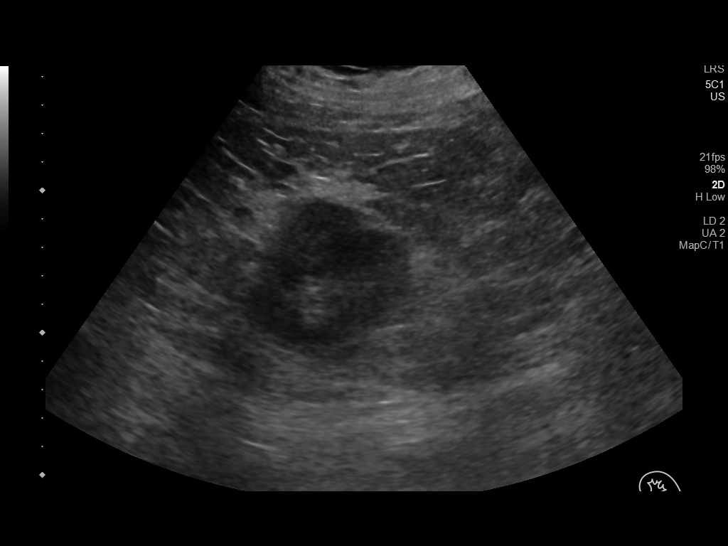
[im 21/21]
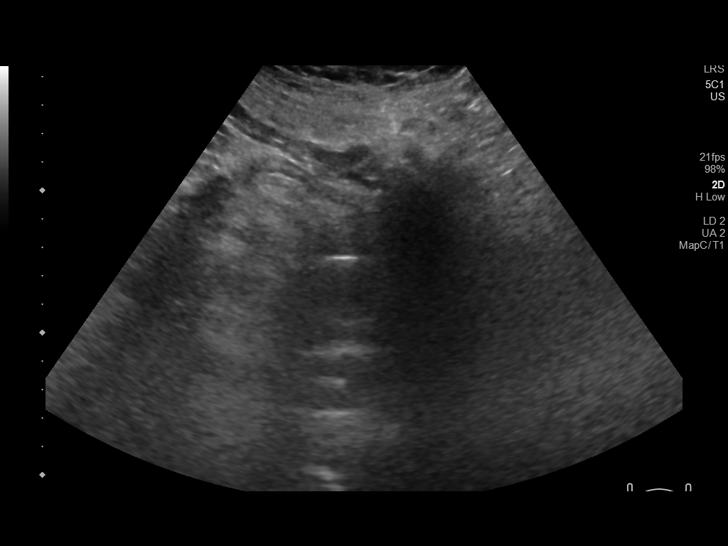

[14 of 21 positions shown; findings below may reference images not displayed]

FINDINGS: Right Kidney:

Renal measurements: 12.0 x 5.1 x 5.6 cm = volume: 180 mL. Exophytic
mass noted on CT arising from the upper pole the right kidney is a
simple appearing cyst on ultrasound measuring 2.5 x 2.3 x 1.9 cm. No
other right renal masses. Normal right renal parenchymal
echogenicity. No stones or hydronephrosis.

Left Kidney:

Renal measurements: 13.7 x 5.0 x 6.4 cm = volume: 228 mL. Normal
parenchymal echogenicity. No masses or stones. No hydronephrosis.
The mild hydronephrosis noted on the previous day's CT is not
visualized sonographically.

Bladder:

Decompressed with a Foley catheter.
IMPRESSION: 1. No hydronephrosis. Specifically, the mild left hydronephrosis
noted on the prior CT, due to a left ureterovesicular junction
stone, is not visualized sonographically. This suggests resolution
with stone passage.
2. Superior pole exophytic mass noted on the prior CT is a simple
appearing cyst sonographically. No additional follow-up recommended.
